# Patient Record
Sex: Male | Born: 1955
Health system: Southern US, Community
[De-identification: ages and names within clinical notes are randomized; demographics above are authoritative.]

## PROBLEM LIST (undated history)

## (undated) DIAGNOSIS — M2060 Acquired deformities of toe(s), unspecified, unspecified foot: Secondary | ICD-10-CM

## (undated) DIAGNOSIS — K5790 Diverticulosis of intestine, part unspecified, without perforation or abscess without bleeding: Secondary | ICD-10-CM

## (undated) DIAGNOSIS — E785 Hyperlipidemia, unspecified: Secondary | ICD-10-CM

## (undated) DIAGNOSIS — N529 Male erectile dysfunction, unspecified: Secondary | ICD-10-CM

## (undated) DIAGNOSIS — K219 Gastro-esophageal reflux disease without esophagitis: Secondary | ICD-10-CM

## (undated) DIAGNOSIS — K921 Melena: Secondary | ICD-10-CM

## (undated) DIAGNOSIS — M21612 Bunion of left foot: Secondary | ICD-10-CM

## (undated) DIAGNOSIS — I480 Paroxysmal atrial fibrillation: Secondary | ICD-10-CM

## (undated) DIAGNOSIS — M21611 Bunion of right foot: Secondary | ICD-10-CM

## (undated) DIAGNOSIS — I358 Other nonrheumatic aortic valve disorders: Secondary | ICD-10-CM

## (undated) HISTORY — DX: Other nonrheumatic aortic valve disorders: I35.8

## (undated) HISTORY — DX: Melena: K92.1

## (undated) HISTORY — PX: ANTERIOR CRUCIATE LIGAMENT REPAIR: SHX115

## (undated) HISTORY — DX: Male erectile dysfunction, unspecified: N52.9

## (undated) HISTORY — PX: VASECTOMY: SHX75

## (undated) HISTORY — DX: Hyperlipidemia, unspecified: E78.5

## (undated) HISTORY — PX: ARTHROSCOPIC REPAIR PCL: SUR81

## (undated) HISTORY — PX: ARTHROSCOPIC REPAIR ACL: SUR80

## (undated) HISTORY — DX: Bunion of left foot: M21.611

## (undated) HISTORY — DX: Gastro-esophageal reflux disease without esophagitis: K21.9

## (undated) HISTORY — PX: CLOSED REDUCTION SHOULDER DISLOCATION: SUR242

## (undated) HISTORY — DX: Paroxysmal atrial fibrillation: I48.0

## (undated) HISTORY — DX: Diverticulosis of intestine, part unspecified, without perforation or abscess without bleeding: K57.90

## (undated) HISTORY — DX: Bunion of right foot: M21.612

## (undated) HISTORY — PX: SHOULDER SURGERY: SHX246

## (undated) HISTORY — PX: OTHER SURGICAL HISTORY: SHX169

## (undated) HISTORY — DX: Acquired deformities of toe(s), unspecified, unspecified foot: M20.60

---

## 2003-12-05 LAB — HM COLONOSCOPY

## 2005-06-05 ENCOUNTER — Encounter: Payer: Self-pay | Admitting: Family Medicine

## 2006-03-08 ENCOUNTER — Encounter: Payer: Self-pay | Admitting: Family Medicine

## 2006-03-09 ENCOUNTER — Encounter: Payer: Self-pay | Admitting: Family Medicine

## 2007-12-05 HISTORY — PX: REFRACTIVE SURGERY: SHX103

## 2008-08-04 ENCOUNTER — Ambulatory Visit: Payer: Self-pay | Admitting: Family Medicine

## 2008-08-04 DIAGNOSIS — Z8719 Personal history of other diseases of the digestive system: Secondary | ICD-10-CM

## 2008-08-06 LAB — CONVERTED CEMR LAB
ALT: 43 units/L (ref 0–53)
AST: 36 units/L (ref 0–37)
Albumin: 4.7 g/dL (ref 3.5–5.2)
BUN: 23 mg/dL (ref 6–23)
Basophils Absolute: 0 10*3/uL (ref 0.0–0.1)
Basophils Relative: 0.2 % (ref 0.0–3.0)
CO2: 28 meq/L (ref 19–32)
Calcium: 9.8 mg/dL (ref 8.4–10.5)
Chloride: 107 meq/L (ref 96–112)
Creatinine, Ser: 0.9 mg/dL (ref 0.4–1.5)
Direct LDL: 119.9 mg/dL
Eosinophils Relative: 0.9 % (ref 0.0–5.0)
Hemoglobin: 15.2 g/dL (ref 13.0–17.0)
Lymphocytes Relative: 20.7 % (ref 12.0–46.0)
MCHC: 34.4 g/dL (ref 30.0–36.0)
MCV: 96 fL (ref 78.0–100.0)
Neutro Abs: 3.6 10*3/uL (ref 1.4–7.7)
Neutrophils Relative %: 70.5 % (ref 43.0–77.0)
RBC: 4.59 M/uL (ref 4.22–5.81)
TSH: 1.16 microintl units/mL (ref 0.35–5.50)
Total Bilirubin: 1.3 mg/dL — ABNORMAL HIGH (ref 0.3–1.2)
Total Protein: 7.8 g/dL (ref 6.0–8.3)
VLDL: 10 mg/dL (ref 0–40)
WBC: 5.1 10*3/uL (ref 4.5–10.5)

## 2008-08-17 ENCOUNTER — Ambulatory Visit: Payer: Self-pay | Admitting: Family Medicine

## 2008-08-17 DIAGNOSIS — E785 Hyperlipidemia, unspecified: Secondary | ICD-10-CM | POA: Insufficient documentation

## 2009-02-15 ENCOUNTER — Ambulatory Visit: Payer: Self-pay | Admitting: Family Medicine

## 2009-02-16 LAB — CONVERTED CEMR LAB
AST: 25 units/L (ref 0–37)
Albumin: 4.9 g/dL (ref 3.5–5.2)
Alkaline Phosphatase: 64 units/L (ref 39–117)
Bilirubin, Direct: 0.1 mg/dL (ref 0.0–0.3)
Indirect Bilirubin: 0.6 mg/dL (ref 0.0–0.9)
LDL Cholesterol: 120 mg/dL — ABNORMAL HIGH (ref 0–99)
Total Bilirubin: 0.7 mg/dL (ref 0.3–1.2)

## 2009-12-04 HISTORY — PX: OTHER SURGICAL HISTORY: SHX169

## 2009-12-04 HISTORY — PX: TENDON REPAIR: SHX5111

## 2009-12-04 HISTORY — PX: WISDOM TOOTH EXTRACTION: SHX21

## 2009-12-04 HISTORY — PX: BUNIONECTOMY: SHX129

## 2010-10-26 ENCOUNTER — Ambulatory Visit: Payer: Self-pay | Admitting: Family Medicine

## 2010-10-30 LAB — CONVERTED CEMR LAB
Albumin: 4.7 g/dL (ref 3.5–5.2)
BUN: 19 mg/dL (ref 6–23)
CO2: 27 meq/L (ref 19–32)
Chloride: 103 meq/L (ref 96–112)
Creatinine, Ser: 0.87 mg/dL (ref 0.40–1.50)
Glucose, Bld: 89 mg/dL (ref 70–99)
Indirect Bilirubin: 0.4 mg/dL (ref 0.0–0.9)
LDL Cholesterol: 133 mg/dL — ABNORMAL HIGH (ref 0–99)
PSA: 0.28 ng/mL (ref <=4.00)
Potassium: 4.5 meq/L (ref 3.5–5.3)
Total Bilirubin: 0.5 mg/dL (ref 0.3–1.2)
Total Protein: 7.2 g/dL (ref 6.0–8.3)
Triglycerides: 67 mg/dL (ref <150)
VLDL: 13 mg/dL (ref 0–40)

## 2010-10-31 LAB — CONVERTED CEMR LAB
Basophils Relative: 0.4 % (ref 0.0–3.0)
Eosinophils Relative: 2.8 % (ref 0.0–5.0)
HCT: 41.3 % (ref 39.0–52.0)
Monocytes Relative: 15.5 % — ABNORMAL HIGH (ref 3.0–12.0)
Neutrophils Relative %: 51.5 % (ref 43.0–77.0)
Platelets: 177 10*3/uL (ref 150.0–400.0)
RBC: 4.34 M/uL (ref 4.22–5.81)
WBC: 2.9 10*3/uL — ABNORMAL LOW (ref 4.5–10.5)

## 2010-11-14 ENCOUNTER — Ambulatory Visit: Payer: Self-pay | Admitting: Family Medicine

## 2010-11-14 DIAGNOSIS — R748 Abnormal levels of other serum enzymes: Secondary | ICD-10-CM | POA: Insufficient documentation

## 2010-11-14 DIAGNOSIS — D72819 Decreased white blood cell count, unspecified: Secondary | ICD-10-CM | POA: Insufficient documentation

## 2010-11-21 LAB — CONVERTED CEMR LAB
ALT: 36 units/L (ref 0–53)
AST: 29 units/L (ref 0–37)
Basophils Relative: 0.2 % (ref 0.0–3.0)
Eosinophils Relative: 2.1 % (ref 0.0–5.0)
HCT: 41.3 % (ref 39.0–52.0)
Hemoglobin: 14.3 g/dL (ref 13.0–17.0)
Lymphocytes Relative: 25.5 % (ref 12.0–46.0)
Monocytes Relative: 6.5 % (ref 3.0–12.0)
Neutro Abs: 3.7 10*3/uL (ref 1.4–7.7)
RBC: 4.31 M/uL (ref 4.22–5.81)

## 2010-12-22 ENCOUNTER — Encounter: Payer: Self-pay | Admitting: Family Medicine

## 2011-01-03 NOTE — Assessment & Plan Note (Signed)
Summary: CPX/CLE   Vital Signs:  Patient profile:   55 year old male Height:      69 inches Weight:      218.75 pounds BMI:     32.42 Temp:     98.5 degrees F oral Pulse rate:   68 / minute Pulse rhythm:   regular BP sitting:   128 / 82  (left arm) Cuff size:   large  Vitals Entered By: Lewanda Rife LPN (October 26, 2010 9:21 AM) CC: CPX   History of Present Illness: here for health mt exam and to review chronic med problems   has been working a lot   had his wisdom teeth pulled 2 days ago  is very relieved about that  has already eaten  a quick recovery is on amox and hydrocodone   had foot surgery with Dr Al Corpus still has some pain- nothing like it was  is able to do more on most days  still needs a fusion   some arthritis in shoulder- dislocated in past -- is working through the pain   wt is up 5 lb will plan to work on that   has been planning some exercise   bp ok at 128/82  lipids last LDL 120 -- in 2009  is time for labs  brother has prostate cancer    colonosc 1/05 with tics only  Td 09 does not want a flu shot   no prostate problems  no change in nocturia  stream is fine     Allergies (verified): No Known Drug Allergies  Past History:  Family History: Last updated: 10/26/2010 Family History Hypertension Family History Lung cancer Family History of Alcoholism/Addiction (other blood relative) Family History Diabetes 1st degree relative (other blood relative)  father -- lung ca, HTN  mother - cancer (? 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 aluminum exposure in a plant (which resolved )   Dr Al Corpus  opthy Dr Mayford Knife   Past Surgical History: (2005) Colonoscopy- tics  (2005) EGD- GERD  ACL sx shoulder sx R fx L wrist  L shoulder dislocation-- reduced under anesthesia  wisdom teeth removed 2011  bunions and hammar toes repaired and tendon repair 2011 lasik surgery 2009  Family History: Family History Hypertension Family History Lung cancer Family History of Alcoholism/Addiction (other blood relative) Family History Diabetes 1st degree relative (other blood relative)  father -- lung ca, HTN  mother - cancer (? primary pancreatic )  sister -- diverticuliits 2 brothers -- diverticulitis  2 brothers alcoholism  aunt DM cousin DM brother with prostate cancer  Review of Systems General:  Denies fatigue, loss of appetite, and malaise. Eyes:  Denies blurring and eye irritation. CV:  Denies chest pain or discomfort, palpitations, and shortness of breath with exertion. Resp:  Denies cough and wheezing. GI:  Denies abdominal pain, change in bowel habits, indigestion,  and nausea. MS:  Denies joint pain, joint redness, and joint swelling. Derm:  Denies itching, lesion(s), poor wound healing, and rash. Neuro:  Denies numbness and tingling. Psych:  Denies anxiety and depression. Endo:  Denies cold intolerance, excessive thirst, excessive urination, and heat intolerance. Heme:  Denies abnormal bruising and bleeding.  Physical Exam  General:  overweight but generally well appearing  Head:  normocephalic, atraumatic, and no abnormalities observed.   Eyes:  vision grossly intact, pupils equal, pupils round, and pupils reactive to light.  no conjunctival  pallor, injection or icterus  Ears:  R ear normal and L ear normal.   Nose:  no nasal discharge.   Mouth:  pharynx pink and moist.   Neck:  supple with full rom and no masses or thyromegally, no JVD or carotid bruit  Chest Wall:  No deformities, masses, tenderness or gynecomastia noted. Lungs:  Normal respiratory effort, chest expands symmetrically. Lungs are clear to auscultation, no crackles or wheezes. Heart:  Normal rate and regular rhythm. S1 and S2 normal without gallop, murmur, click, rub or other extra sounds. Abdomen:  Bowel sounds positive,abdomen soft and non-tender without masses, organomegaly or hernias noted.  no renal bruits  Rectal:  No external abnormalities noted. Normal sphincter tone. No rectal masses or tenderness. Prostate:  Prostate gland firm and smooth, no enlargement, nodularity, tenderness, mass, asymmetry or induration. Msk:  No deformity or scoliosis noted of thoracic or lumbar spine.  no acute joint changes Pulses:  R and L carotid,radial,femoral,dorsalis pedis and posterior tibial pulses are full and equal bilaterally Extremities:  No clubbing, cyanosis, edema, or deformity noted with normal full range of motion of all joints.   Neurologic:  sensation intact to light touch, gait normal, and DTRs symmetrical and normal.   Skin:  flesh colored shiny papule L of nose  erythematous area 1 cm under R eye lentigos on back Cervical Nodes:  No lymphadenopathy noted Inguinal Nodes:  No significant adenopathy Psych:  normal affect, talkative and pleasant    Impression & Recommendations:  Problem # 1:  HEALTH MAINTENANCE EXAM (ICD-V70.0) Assessment Comment Only reviewed health habits including diet, exercise and skin cancer prevention reviewed health maintenance list and family history  wellness labs today  Orders: Venipuncture (60454) TLB-CBC Platelet - w/Differential (85025-CBCD) T- * Misc. Laboratory test [phone])  Problem # 2:  SPECIAL SCREENING MALIGNANT  NEOPLASM OF PROSTATE (ICD-V76.44) Assessment: Comment Only nl dre check psa  family hx - aware  no symptoms - disc what to watch for   Problem # 3:  HYPERLIPIDEMIA (ICD-272.4) Assessment: Unchanged  rev low sat fat diet  lab today Orders: Venipuncture (91478) TLB-CBC Platelet - w/Differential (85025-CBCD) T- * Misc. Laboratory test [phone])  Labs Reviewed: SGOT: 25 (02/15/2009)   SGPT: 40 (02/15/2009)   HDL:58 (02/15/2009), 44.3 (08/04/2008)  LDL:120 (02/15/2009), DEL (08/04/2008)  Chol:193 (02/15/2009), 202 (08/04/2008)  Trig:74 (02/15/2009), 52 (08/04/2008)  Problem # 4:  NEOPLASM OF UNCERTAIN BEHAVIOR OF SKIN (ICD-238.2) Assessment: New area of redness under R eye as well as several flesh colored moles on face ref to derm  Orders: Dermatology Referral (Derma)  Complete Medication List: 1)  Amoxicillin 500 Mg Caps (Amoxicillin) .... One capsule by mouth three times a day until finished 2)  Hydrocodone-acetaminophen 7.5-750 Mg Tabs (Hydrocodone-acetaminophen) .... One tablet by mouth every 4-6 hours as needed for dental pain.  Patient Instructions: 1)  keep working on healthy diet and exercise  2)  wellness labs today including psa   Orders Added:  1)  Venipuncture [16109] 2)  TLB-CBC Platelet - w/Differential [85025-CBCD] 3)  Dermatology Referral [Derma] 4)  T- * Misc. Laboratory test [99999] 5)  Est. Patient 40-64 years [phone]    Current Allergies (reviewed today): No known allergies

## 2011-01-05 NOTE — Assessment & Plan Note (Signed)
Summary: FOLLOW UP LAB PER DR TOWER/RI   Vital Signs:  Patient profile:   55 year old male Height:      69 inches Weight:      222.25 pounds BMI:     32.94 Temp:     98 degrees F oral Pulse rate:   64 / minute Pulse rhythm:   regular BP sitting:   114 / 72  (left arm) Cuff size:   large  Vitals Entered By: Lewanda Rife LPN (November 14, 2010 4:06 PM) CC: 30 min appt for follow up after labs   History of Present Illness: here for f/u of lipids and low wbc as well as inc lfts  wt i sup 3 lb  bp good   ast/alt 51 and 62 which is up  has been on some amoxicillin for wisdom teeth  also codiene with acetaminophen   LDL chol up from 120 to 133  wbc is 2.9  no cold or flu symptoms no fever  feels good overall   his aches and pains are not new   drinks some beer on the weekend - but not lately  not a smoker    no one in family with blood disorder    Allergies (verified): No Known Drug Allergies  Past History:  Past Medical History: Last updated: 10/26/2010 Diverticulitis, hx of Blood in stool, hx of Fungus on R great toe 2nd toe deformed Bunnions bilat feet GERD-- took prilosec for a while  zoster 9/09 mild hyperlipidemia  past hx of inc lfts-- from ? aluminum exposure in a plant (which resolved )   Dr Al Corpus  opthy Dr Mayford Knife   Past Surgical History: Last updated: 10/26/2010 (2005) Colonoscopy- tics  (2005) EGD- GERD  ACL sx shoulder sx R fx L wrist  L shoulder dislocation-- reduced under anesthesia  wisdom teeth removed 2011  bunions and hammar toes repaired and tendon repair 2011 lasik surgery 2009  Family History: Last updated: 10/26/2010 Family History Hypertension Family History Lung cancer Family History of Alcoholism/Addiction (other blood relative) Family History Diabetes 1st degree relative (other blood relative)  father -- lung ca, HTN  mother - cancer (? 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 Eyes:  Denies blurring and eye irritation. CV:  Denies chest pain or discomfort and lightheadness. Resp:  Denies cough, shortness of breath, and wheezing. GI:  Denies abdominal pain, change in bowel habits, indigestion, nausea, and vomiting. GU:  Denies dysuria and urinary frequency. MS:  Denies joint pain, joint redness, and joint swelling. Derm:  Denies itching, lesion(s), poor wound healing, and rash. Neuro:  Denies headaches, numbness, and tingling. Psych:  Denies anxiety and depression. Endo:  Denies cold intolerance and heat intolerance. Heme:  Denies abnormal bruising and bleeding.  Physical Exam  General:  overweight but generally well appearing  Head:  normocephalic, atraumatic, and no abnormalities observed.   Eyes:  vision grossly intact, pupils equal, pupils round, and pupils reactive to light.  no conjunctival pallor, injection or icterus  Mouth:  pharynx pink and  moist.   Neck:  supple with full rom and no masses or thyromegally, no JVD or carotid bruit  Chest Wall:  No deformities, masses, tenderness or gynecomastia noted. Lungs:  Normal respiratory effort, chest expands symmetrically. Lungs are clear to auscultation, no crackles or wheezes. Heart:  Normal rate and regular rhythm. S1 and S2 normal without gallop, murmur, click, rub or other extra sounds. Abdomen:  Bowel sounds positive,abdomen soft and non-tender without masses, organomegaly or hernias noted.  no renal bruits  Msk:  No deformity or  scoliosis noted of thoracic or lumbar spine.  no acute joint changes Pulses:  R and L carotid,radial,femoral,dorsalis pedis and posterior tibial pulses are full and equal bilaterally Extremities:  No clubbing, cyanosis, edema, or deformity noted with normal full range of motion of all joints.   Neurologic:  sensation intact to light touch, gait normal, and DTRs symmetrical and normal.   Skin:  flesh colored shiny papule L of nose  erythematous area 1 cm under R eye lentigos on back Cervical Nodes:  No lymphadenopathy noted Inguinal Nodes:  No significant adenopathy Psych:  normal affect, talkative and pleasant    Impression & Recommendations:  Problem # 1:  LEUKOCYTOPENIA UNSPECIFIED (ICD-288.50) Assessment New re check this  no outside reason for it  asymptomatic  if abn- willref to heme Orders: Venipuncture (16109) TLB-ALT (SGPT) (84460-ALT) TLB-AST (SGOT) (84450-SGOT) TLB-CBC Platelet - w/Differential (85025-CBCD)  Problem # 2:  OTHER NONSPECIFIC ABNORMAL SERUM ENZYME LEVELS (ICD-790.5) Assessment: Deteriorated disc cutting out alcohol  also avoid tylenol and otc meds and herbs  Orders: Venipuncture (60454) TLB-ALT (SGPT) (84460-ALT) TLB-AST (SGOT) (84450-SGOT) TLB-CBC Platelet - w/Differential (85025-CBCD)  Problem # 3:  HYPERLIPIDEMIA (ICD-272.4) Assessment: Deteriorated chol up a bit  disc dec sat fats in diet and inc fiber and exercise  will continue to monitor  rev goals for hDL and LDL  Labs Reviewed: SGOT: 51 (10/26/2010)   SGPT: 62 (10/26/2010)   HDL:65 (10/26/2010), 58 (02/15/2009)  LDL:133 (10/26/2010), 120 (02/15/2009)  Chol:211 (10/26/2010), 193 (02/15/2009)  Trig:67 (10/26/2010), 74 (02/15/2009)  Patient Instructions: 1)  checking labs for white blood cell count and liver today  2)  no tylenol/ acetaminophen  3)  please minimize alcohol as much as possible- it is not good for your liver and has lots of calories  4)  if you have fever or any other  symptoms - let me know  5)  if your white count is not improved -- I will refer you to a hematologist (blood specialist)    Orders Added: 1)  Venipuncture [36415] 2)  TLB-ALT (SGPT) [84460-ALT] 3)  TLB-AST (SGOT) [84450-SGOT] 4)  TLB-CBC Platelet - w/Differential [85025-CBCD] 5)  Est. Patient Level IV [09811]    Prior Medications (reviewed today): None Current Allergies (reviewed today): No known allergies

## 2011-01-19 NOTE — Consult Note (Signed)
Summary: [facility]  [facility]   Imported By: Maryln Gottron 01/06/2011 14:55:24  _____________________________________________________________________  External Attachment:    Type:   Image     Comment:   External Document

## 2011-04-22 ENCOUNTER — Inpatient Hospital Stay (INDEPENDENT_AMBULATORY_CARE_PROVIDER_SITE_OTHER)
Admission: RE | Admit: 2011-04-22 | Discharge: 2011-04-22 | Disposition: A | Discharge status: Home / Self Care | Payer: BC Managed Care – PPO | Source: Ambulatory Visit | Attending: Family Medicine | Admitting: Family Medicine

## 2011-04-22 DIAGNOSIS — H6120 Impacted cerumen, unspecified ear: Secondary | ICD-10-CM

## 2011-04-22 DIAGNOSIS — H60399 Other infective otitis externa, unspecified ear: Secondary | ICD-10-CM

## 2011-05-16 ENCOUNTER — Other Ambulatory Visit: Payer: Self-pay | Admitting: Family Medicine

## 2011-05-16 DIAGNOSIS — D72819 Decreased white blood cell count, unspecified: Secondary | ICD-10-CM

## 2011-05-16 DIAGNOSIS — R748 Abnormal levels of other serum enzymes: Secondary | ICD-10-CM

## 2011-05-16 DIAGNOSIS — E785 Hyperlipidemia, unspecified: Secondary | ICD-10-CM

## 2011-05-24 ENCOUNTER — Other Ambulatory Visit: Payer: Self-pay

## 2011-05-24 ENCOUNTER — Other Ambulatory Visit (INDEPENDENT_AMBULATORY_CARE_PROVIDER_SITE_OTHER): Payer: BC Managed Care – PPO

## 2011-05-24 DIAGNOSIS — D72819 Decreased white blood cell count, unspecified: Secondary | ICD-10-CM

## 2011-05-24 DIAGNOSIS — R748 Abnormal levels of other serum enzymes: Secondary | ICD-10-CM

## 2011-05-24 DIAGNOSIS — E785 Hyperlipidemia, unspecified: Secondary | ICD-10-CM

## 2011-05-25 LAB — CBC WITH DIFFERENTIAL/PLATELET
Basophils Relative: 0.2 % (ref 0.0–3.0)
Eosinophils Relative: 1.1 % (ref 0.0–5.0)
HCT: 42.4 % (ref 39.0–52.0)
Hemoglobin: 14.9 g/dL (ref 13.0–17.0)
Lymphs Abs: 1.4 10*3/uL (ref 0.7–4.0)
MCHC: 35 g/dL (ref 30.0–36.0)
MCV: 96.3 fl (ref 78.0–100.0)
Monocytes Absolute: 0.4 10*3/uL (ref 0.1–1.0)
Neutro Abs: 3.9 10*3/uL (ref 1.4–7.7)
RBC: 4.4 Mil/uL (ref 4.22–5.81)
WBC: 5.8 10*3/uL (ref 4.5–10.5)

## 2011-05-25 LAB — HEPATIC FUNCTION PANEL
Bilirubin, Direct: 0.1 mg/dL (ref 0.0–0.3)
Total Bilirubin: 0.9 mg/dL (ref 0.3–1.2)

## 2011-05-25 LAB — LIPID PANEL
HDL: 61.1 mg/dL (ref >39.00)
Total CHOL/HDL Ratio: 4
Triglycerides: 65 mg/dL (ref 0.0–149.0)

## 2011-05-31 ENCOUNTER — Telehealth: Payer: Self-pay

## 2011-05-31 NOTE — Telephone Encounter (Signed)
Patient notified as i/nstructed by telephone. Fasting Lab appt scheduled as instructed 11/14/11 at 4pm with CPX scheduled 11/21/11 at 2:30pm.

## 2011-05-31 NOTE — Telephone Encounter (Signed)
Message copied by Patience Musca on Wed May 31, 2011 11:12 AM ------      Message from: Roxy Manns A      Created: Sun May 28, 2011 12:47 PM       Chol is up a bit -- for the most part stable      Avoid red meat/ fried foods/ egg yolks/ fatty breakfast meats/ butter, cheese and high fat dairy/ and shellfish       Liver tests normal       Blood count normal - reassuring       F/u dec for PE with labs prior

## 2011-11-13 ENCOUNTER — Telehealth: Payer: Self-pay | Admitting: Internal Medicine

## 2011-11-13 DIAGNOSIS — Z125 Encounter for screening for malignant neoplasm of prostate: Secondary | ICD-10-CM | POA: Insufficient documentation

## 2011-11-13 DIAGNOSIS — E785 Hyperlipidemia, unspecified: Secondary | ICD-10-CM

## 2011-11-13 DIAGNOSIS — Z0000 Encounter for general adult medical examination without abnormal findings: Secondary | ICD-10-CM | POA: Insufficient documentation

## 2011-11-13 NOTE — Telephone Encounter (Signed)
Patient had a lab appointment for tomorrow at 4:00.  Just wanted to know if he has to fast all day because he stated he would be at work

## 2011-11-13 NOTE — Telephone Encounter (Signed)
Message copied by Judy Pimple on Mon Nov 13, 2011  9:07 PM ------      Message from: Alvina Chou      Created: Mon Nov 13, 2011  3:43 PM      Regarding: labs for Tues 12-11       Patient is scheduled for CPX labs, please order future labs, Thanks , Camelia Eng

## 2011-11-14 ENCOUNTER — Other Ambulatory Visit: Payer: BC Managed Care – PPO

## 2011-11-21 ENCOUNTER — Encounter: Payer: Self-pay | Admitting: Family Medicine

## 2011-11-21 ENCOUNTER — Ambulatory Visit (INDEPENDENT_AMBULATORY_CARE_PROVIDER_SITE_OTHER): Payer: BC Managed Care – PPO | Admitting: Family Medicine

## 2011-11-21 VITALS — BP 118/76 | HR 72 | Temp 98.0°F | Ht 69.0 in | Wt 220.8 lb

## 2011-11-21 DIAGNOSIS — E785 Hyperlipidemia, unspecified: Secondary | ICD-10-CM

## 2011-11-21 DIAGNOSIS — Z23 Encounter for immunization: Secondary | ICD-10-CM

## 2011-11-21 DIAGNOSIS — Z0000 Encounter for general adult medical examination without abnormal findings: Secondary | ICD-10-CM

## 2011-11-21 DIAGNOSIS — Z125 Encounter for screening for malignant neoplasm of prostate: Secondary | ICD-10-CM

## 2011-11-21 NOTE — Patient Instructions (Addendum)
Flu shot today Work on healthy diet and exercise for weight loss  Labs today

## 2011-11-21 NOTE — Assessment & Plan Note (Signed)
Reviewed health habits including diet and exercise and skin cancer prevention Also reviewed health mt list, fam hx and immunizations   Wellness labs today Outlined plan of reduced calorie diet and exercise for wt loss

## 2011-11-21 NOTE — Assessment & Plan Note (Signed)
Lipids last time mildly high Disc goals for lipids and reasons to control them Rev labs with pt Rev low sat fat diet in detail Did work on diet transiently  Given handout  Labs today

## 2011-11-21 NOTE — Progress Notes (Signed)
Subjective:    Patient ID: Shane Gonzalez, male    DOB: May 10, 1956, 55 y.o.   MRN: [id number]  HPI Here for health maintenance exam and to review chronic medical problems   Is doing well overall  Getting more aches and pains the older he gets -- feet and knees at times   Had foot surgery a while back Had to also get rid of his orthotics  Works on Research scientist (medical) he has oa knees- shoulder surgery / etc   Does some exercise - limited by rotator cuff problems  Eventually wants to take care of it   Very busy -- daughters getting married   bp good at 118/76 Wt is down 2 lb with bmi of 32  Wants to loose even more  Lost more and gained some back -- candy and treats lately   Going to [place] in feb- looking forward to it    Wants to do his labs today   Hx of mildly high lipids Lab Results  Component Value Date   CHOL 223* 05/24/2011   HDL 61.10 05/24/2011   LDLCALC 133* 10/26/2010   LDLDIRECT 129.9 05/24/2011   TRIG 65.0 05/24/2011   CHOLHDL 4 05/24/2011    Diet - was better for a while and then fell off the wagon  Disc low sat fat at last visit  Is drinking juice - thinks that helps    Prostate screen - no problems at all  Nocturia 1-2 times per night all his life - drinks a lot of green tea  Brother had prostate cancer   Td 09 Flu shot - has not had   Colon screen - colonosc was 1/05 with diverticulosis - 10 year follow up   Patient Active Problem List  Diagnoses  . HYPERLIPIDEMIA  . DIVERTICULITIS, HX OF  . LEUKOCYTOPENIA UNSPECIFIED  . OTHER NONSPECIFIC ABNORMAL SERUM ENZYME LEVELS  . Routine general medical examination at a health care facility  . Prostate cancer screening   Past Medical History  Diagnosis Date  . Diverticulosis     Hx of  . Blood in stool     Hx of  . Toenail fungus     rt big toe  . Bilateral bunions     both feet  . Toe deformity     2nd  . GERD (gastroesophageal reflux disease)   . Hyperlipidemia    Past Surgical History    Procedure Date  . Anterior cruciate ligament repair   . Shoulder surgery     right  . Closed reduction shoulder dislocation     left reduced under anesthesia  . Wisdom tooth extraction 2011  . Bunionectomy 2011  . Hammer toe repair 2011  . Tendon repair 2011  . Refractive surgery 2009   History  Substance Use Topics  . Smoking status: Never Smoker   . Smokeless tobacco: Not on file  . Alcohol Use: Yes     weekends 4 beers   Family History  Problem Relation Age of Onset  . Cancer Mother     ? pancreatic  . Cancer Father     lung ca  . Hypertension Father   . Diverticulitis Sister   . Diverticulitis Brother    No Known Allergies No current outpatient prescriptions on file prior to visit.          Review of Systems Review of Systems  Constitutional: Negative for fever, appetite change, fatigue and unexpected weight change.  Eyes: Negative for pain  and visual disturbance.  Respiratory: Negative for cough and shortness of breath.   Cardiovascular: Negative for cp or palpitations    Gastrointestinal: Negative for nausea, diarrhea and constipation.  Genitourinary: Negative for urgency and frequency.  Skin: Negative for pallor or rash   MSK pos for knee pain and shoulder pain with arthritis, no joint swelling  Neurological: Negative for weakness, light-headedness, numbness and headaches.  Hematological: Negative for adenopathy. Does not bruise/bleed easily.  Psychiatric/Behavioral: Negative for dysphoric mood. The patient is not nervous/anxious.          Objective:   Physical Exam  Constitutional: He appears well-developed and well-nourished. No distress.  HENT:  Head: Normocephalic and atraumatic.  Right Ear: External ear normal.  Left Ear: External ear normal.  Nose: Nose normal.  Mouth/Throat: Oropharynx is clear and moist.       Scant cerumen  Eyes: Conjunctivae and EOM are normal. Pupils are equal, round, and reactive to light. No scleral icterus.   Neck: Normal range of motion. Neck supple. No JVD present. Carotid bruit is not present. No thyromegaly present.  Cardiovascular: Normal rate, regular rhythm, normal heart sounds and intact distal pulses.  Exam reveals no gallop.   Pulmonary/Chest: Effort normal and breath sounds normal. No respiratory distress. He has no wheezes. He exhibits no tenderness.  Abdominal: Soft. Bowel sounds are normal. He exhibits no distension, no abdominal bruit and no mass. There is no tenderness.  Genitourinary: Rectum normal and prostate normal. Guaiac negative stool. Prostate is not enlarged and not tender.  Musculoskeletal: Normal range of motion. He exhibits no edema and no tenderness.  Lymphadenopathy:    He has no cervical adenopathy.  Neurological: He is alert. He has normal reflexes. No cranial nerve deficit. He exhibits normal muscle tone. Coordination normal.  Skin: Skin is warm and dry. No rash noted. No erythema. No pallor.       Solar lentigos diffusely   Psychiatric: He has a normal mood and affect.          Assessment & Plan:

## 2011-11-21 NOTE — Assessment & Plan Note (Signed)
Nl DRE today Brother had prostate cancer psa today and adv No change in nocturia

## 2011-11-22 LAB — CBC WITH DIFFERENTIAL/PLATELET
Basophils Absolute: 0 10*3/uL (ref 0.0–0.1)
Basophils Relative: 0.4 % (ref 0.0–3.0)
HCT: 41.7 % (ref 39.0–52.0)
Hemoglobin: 14.4 g/dL (ref 13.0–17.0)
Lymphs Abs: 1.4 10*3/uL (ref 0.7–4.0)
MCHC: 34.5 g/dL (ref 30.0–36.0)
Monocytes Relative: 7.4 % (ref 3.0–12.0)
Neutro Abs: 3.8 10*3/uL (ref 1.4–7.7)
RBC: 4.33 Mil/uL (ref 4.22–5.81)
RDW: 13 % (ref 11.5–14.6)

## 2011-11-22 LAB — LIPID PANEL
Cholesterol: 213 mg/dL — ABNORMAL HIGH (ref 0–200)
HDL: 65.6 mg/dL (ref >39.00)
Triglycerides: 50 mg/dL (ref 0.0–149.0)

## 2011-11-22 LAB — COMPREHENSIVE METABOLIC PANEL
ALT: 44 U/L (ref 0–53)
BUN: 20 mg/dL (ref 6–23)
CO2: 27 mEq/L (ref 19–32)
Calcium: 9.6 mg/dL (ref 8.4–10.5)
Chloride: 104 mEq/L (ref 96–112)
Creatinine, Ser: 1 mg/dL (ref 0.4–1.5)
GFR: 87.89 mL/min (ref >60.00)
Glucose, Bld: 82 mg/dL (ref 70–99)
Total Bilirubin: 1.3 mg/dL — ABNORMAL HIGH (ref 0.3–1.2)

## 2011-11-22 LAB — TSH: TSH: 2.02 u[IU]/mL (ref 0.35–5.50)

## 2012-11-22 ENCOUNTER — Encounter: Payer: BC Managed Care – PPO | Admitting: Family Medicine

## 2013-03-25 ENCOUNTER — Telehealth: Payer: Self-pay | Admitting: Family Medicine

## 2013-03-25 DIAGNOSIS — E785 Hyperlipidemia, unspecified: Secondary | ICD-10-CM

## 2013-03-25 DIAGNOSIS — Z0000 Encounter for general adult medical examination without abnormal findings: Secondary | ICD-10-CM

## 2013-03-25 DIAGNOSIS — Z125 Encounter for screening for malignant neoplasm of prostate: Secondary | ICD-10-CM

## 2013-03-25 NOTE — Telephone Encounter (Signed)
Message copied by Judy Pimple on Tue Mar 25, 2013  5:54 PM ------      Message from: Alvina Chou      Created: Tue Mar 18, 2013 12:17 PM      Regarding: lab orders for Wednesday, 4.23.14       Patient is scheduled for CPX labs, please order future labs, Thanks , Terri       ------

## 2013-03-26 ENCOUNTER — Other Ambulatory Visit (INDEPENDENT_AMBULATORY_CARE_PROVIDER_SITE_OTHER): Payer: BC Managed Care – PPO

## 2013-03-26 DIAGNOSIS — Z125 Encounter for screening for malignant neoplasm of prostate: Secondary | ICD-10-CM

## 2013-03-26 DIAGNOSIS — E785 Hyperlipidemia, unspecified: Secondary | ICD-10-CM

## 2013-03-26 DIAGNOSIS — Z0000 Encounter for general adult medical examination without abnormal findings: Secondary | ICD-10-CM

## 2013-03-27 LAB — CBC WITH DIFFERENTIAL/PLATELET
Eosinophils Relative: 1.6 % (ref 0.0–5.0)
HCT: 41.3 % (ref 39.0–52.0)
Lymphs Abs: 1.3 10*3/uL (ref 0.7–4.0)
Monocytes Relative: 6.5 % (ref 3.0–12.0)
Platelets: 196 10*3/uL (ref 150.0–400.0)
WBC: 5.4 10*3/uL (ref 4.5–10.5)

## 2013-03-27 LAB — COMPREHENSIVE METABOLIC PANEL
Albumin: 4.7 g/dL (ref 3.5–5.2)
CO2: 30 mEq/L (ref 19–32)
Calcium: 9.5 mg/dL (ref 8.4–10.5)
Chloride: 101 mEq/L (ref 96–112)
GFR: 99.42 mL/min (ref >60.00)
Glucose, Bld: 72 mg/dL (ref 70–99)
Potassium: 4.2 mEq/L (ref 3.5–5.1)
Sodium: 137 mEq/L (ref 135–145)
Total Bilirubin: 0.9 mg/dL (ref 0.3–1.2)
Total Protein: 7.1 g/dL (ref 6.0–8.3)

## 2013-03-27 LAB — LDL CHOLESTEROL, DIRECT: Direct LDL: 172.9 mg/dL

## 2013-03-27 LAB — LIPID PANEL: Cholesterol: 231 mg/dL — ABNORMAL HIGH (ref 0–200)

## 2013-04-04 ENCOUNTER — Encounter: Payer: Self-pay | Admitting: Family Medicine

## 2013-04-04 ENCOUNTER — Ambulatory Visit (INDEPENDENT_AMBULATORY_CARE_PROVIDER_SITE_OTHER): Payer: BC Managed Care – PPO | Admitting: Family Medicine

## 2013-04-04 VITALS — BP 128/78 | HR 76 | Temp 98.7°F | Ht 69.0 in | Wt 222.5 lb

## 2013-04-04 DIAGNOSIS — Z8042 Family history of malignant neoplasm of prostate: Secondary | ICD-10-CM

## 2013-04-04 DIAGNOSIS — E785 Hyperlipidemia, unspecified: Secondary | ICD-10-CM

## 2013-04-04 DIAGNOSIS — Z0000 Encounter for general adult medical examination without abnormal findings: Secondary | ICD-10-CM

## 2013-04-04 DIAGNOSIS — Z23 Encounter for immunization: Secondary | ICD-10-CM

## 2013-04-04 NOTE — Assessment & Plan Note (Signed)
Reviewed health habits including diet and exercise and skin cancer prevention Also reviewed health mt list, fam hx and immunizations  Rev wellness lab in detail  Tdap today (will be around a newborn)

## 2013-04-04 NOTE — Progress Notes (Signed)
Subjective:    Patient ID: Shane Gonzalez, male    DOB: [birth year], 57 y.o.   MRN: [id number]  HPI Here for health maintenance exam and to review chronic medical problems    Has been working a lot and taking care of himself  He recently lose 8 lb - happy about that - plans to keep working on that Is using the total gym   He is addicted to peanut m and m's - knows there is a lot of sugar and fat in them  Expecting new grandchild - excited about that    Wt is up 2 lb with bmi of 32  Flu vaccine-had one in 2013 fall   Td 1/09 - needs a Tdap due to new baby upcoming   Colonoscopy 1/05 with diverticulosis- 10 year recall   Family hx of prostate ca in brother Lab Results  Component Value Date   PSA 0.35 03/26/2013   PSA 0.43 11/21/2011   PSA 0.28 10/26/2010  no prostate problems or difficulty voiding  Nocturia usually 2 times (his whole life)  Drinks a lot of green tea     Hx of hyperlipidemia Lab Results  Component Value Date   CHOL 231* 03/26/2013   CHOL 213* 11/21/2011   CHOL 223* 05/24/2011   Lab Results  Component Value Date   HDL 54.70 03/26/2013   HDL 40.98 11/21/2011   HDL 61.10 05/24/2011   Lab Results  Component Value Date   LDLCALC 133* 10/26/2010   LDLCALC 120* 02/15/2009   Lab Results  Component Value Date   TRIG 66.0 03/26/2013   TRIG 50.0 11/21/2011   TRIG 65.0 05/24/2011   Lab Results  Component Value Date   CHOLHDL 4 03/26/2013   CHOLHDL 3 11/21/2011   CHOLHDL 4 05/24/2011   Lab Results  Component Value Date   LDLDIRECT 172.9 03/26/2013   LDLDIRECT 133.3 11/21/2011   LDLDIRECT 129.9 05/24/2011   his diet was better early this year-now worse- is back to eating red meat and fried food and fattier foods      Chemistry      Component Value Date/Time   NA 137 03/26/2013 1601   K 4.2 03/26/2013 1601   CL 101 03/26/2013 1601   CO2 30 03/26/2013 1601   BUN 21 03/26/2013 1601   CREATININE 0.9 03/26/2013 1601      Component Value Date/Time   CALCIUM  9.5 03/26/2013 1601   ALKPHOS 64 03/26/2013 1601   AST 30 03/26/2013 1601   ALT 47 03/26/2013 1601   BILITOT 0.9 03/26/2013 1601     Lab Results  Component Value Date   TSH 1.95 03/26/2013   Lab Results  Component Value Date   WBC 5.4 03/26/2013   HGB 14.5 03/26/2013   HCT 41.3 03/26/2013   MCV 93.3 03/26/2013   PLT 196.0 03/26/2013      Review of Systems    Review of Systems  Constitutional: Negative for fever, appetite change, fatigue and unexpected weight change.  Eyes: Negative for pain and visual disturbance.  Respiratory: Negative for cough and shortness of breath.   Cardiovascular: Negative for cp or palpitations    Gastrointestinal: Negative for nausea, diarrhea and constipation.  Genitourinary: Negative for urgency and frequency.  Skin: Negative for pallor or rash   Neurological: Negative for weakness, light-headedness, numbness and headaches.  Hematological: Negative for adenopathy. Does not bruise/bleed easily.  Psychiatric/Behavioral: Negative for dysphoric mood. The patient is not nervous/anxious.      Objective:  Physical Exam  Constitutional: He appears well-developed and well-nourished.  obese and well appearing   HENT:  Head: Normocephalic and atraumatic.  Right Ear: External ear normal.  Left Ear: External ear normal.  Nose: Nose normal.  Eyes: Conjunctivae and EOM are normal. Pupils are equal, round, and reactive to light. Right eye exhibits no discharge. Left eye exhibits no discharge. No scleral icterus.  Neck: Normal range of motion. Neck supple. No JVD present. Carotid bruit is not present. No thyromegaly present.  Cardiovascular: Normal rate, regular rhythm, normal heart sounds and intact distal pulses.  Exam reveals no gallop.   Pulmonary/Chest: Effort normal and breath sounds normal. No respiratory distress. He has no wheezes. He has no rales.  No crackles  Abdominal: Soft. Bowel sounds are normal. He exhibits no distension, no abdominal bruit and no  mass. There is no tenderness.  Genitourinary: Rectum normal and prostate normal. Prostate is not enlarged and not tender.  Musculoskeletal: He exhibits no edema and no tenderness.  Lymphadenopathy:    He has no cervical adenopathy.  Neurological: He is alert. He has normal reflexes. No cranial nerve deficit. He exhibits normal muscle tone. Coordination normal.  Skin: Skin is warm and dry. No rash noted. No erythema. No pallor.  Solar lentigos diffusely   Psychiatric: He has a normal mood and affect.          Assessment & Plan:

## 2013-04-04 NOTE — Assessment & Plan Note (Signed)
Cholesterol is up  Disc goals for lipids and reasons to control them Rev labs with pt Rev low sat fat diet in detail  If no imp will need statin Re check 3 mo

## 2013-04-04 NOTE — Patient Instructions (Addendum)
Tdap vaccine today- that is important since you will be around a newborn  For cholesterol --Avoid red meat/ fried foods/ egg yolks/ fatty breakfast meats/ butter, cheese and high fat dairy/ and shellfish   Schedule fasting labs in 3 months for cholesterol profile Keep exercising

## 2013-04-04 NOTE — Assessment & Plan Note (Signed)
Stable/low psa , no symptoms and nl DRE today Will continue to follow

## 2013-07-07 ENCOUNTER — Telehealth: Payer: Self-pay | Admitting: Family Medicine

## 2013-07-07 DIAGNOSIS — E785 Hyperlipidemia, unspecified: Secondary | ICD-10-CM

## 2013-07-07 NOTE — Telephone Encounter (Signed)
Future labs 

## 2013-07-08 ENCOUNTER — Other Ambulatory Visit: Payer: BC Managed Care – PPO

## 2013-07-09 ENCOUNTER — Other Ambulatory Visit (INDEPENDENT_AMBULATORY_CARE_PROVIDER_SITE_OTHER): Payer: BC Managed Care – PPO

## 2013-07-09 DIAGNOSIS — E785 Hyperlipidemia, unspecified: Secondary | ICD-10-CM

## 2013-07-10 LAB — LDL CHOLESTEROL, DIRECT: Direct LDL: 143 mg/dL

## 2013-07-10 LAB — LIPID PANEL
Cholesterol: 214 mg/dL — ABNORMAL HIGH (ref 0–200)
HDL: 65.6 mg/dL (ref >39.00)
VLDL: 7.8 mg/dL (ref 0.0–40.0)

## 2013-07-11 ENCOUNTER — Encounter: Payer: Self-pay | Admitting: *Deleted

## 2015-04-13 ENCOUNTER — Encounter: Payer: Self-pay | Admitting: *Deleted

## 2015-04-20 ENCOUNTER — Ambulatory Visit (INDEPENDENT_AMBULATORY_CARE_PROVIDER_SITE_OTHER): Payer: BLUE CROSS/BLUE SHIELD | Admitting: Family Medicine

## 2015-04-20 ENCOUNTER — Encounter: Payer: Self-pay | Admitting: Family Medicine

## 2015-04-20 VITALS — BP 120/74 | HR 64 | Temp 98.3°F | Resp 14 | Ht 69.0 in | Wt 212.0 lb

## 2015-04-20 DIAGNOSIS — Z801 Family history of malignant neoplasm of trachea, bronchus and lung: Secondary | ICD-10-CM

## 2015-04-20 DIAGNOSIS — Z125 Encounter for screening for malignant neoplasm of prostate: Secondary | ICD-10-CM | POA: Diagnosis not present

## 2015-04-20 DIAGNOSIS — Z1211 Encounter for screening for malignant neoplasm of colon: Secondary | ICD-10-CM | POA: Diagnosis not present

## 2015-04-20 DIAGNOSIS — Z0000 Encounter for general adult medical examination without abnormal findings: Secondary | ICD-10-CM | POA: Diagnosis not present

## 2015-04-20 DIAGNOSIS — E785 Hyperlipidemia, unspecified: Secondary | ICD-10-CM | POA: Diagnosis not present

## 2015-04-20 NOTE — Assessment & Plan Note (Signed)
CPE done, fasting labs, PSA screening 1st degree relative Colon cancer screening- referral to GI With inhalents and exposures with job, family history of lung cancer, CXR to be done Immunizations UTD

## 2015-04-20 NOTE — Progress Notes (Signed)
Patient ID: Shane Gonzalez, male   DOB: [birth year], 59 y.o.   MRN: [id number]   Subjective:    Patient ID: Shane Gonzalez, male    DOB: 07-14-1956, 59 y.o.   MRN: [id number]  Patient presents for CPE  is here to establish care for physical exam. His previous PCP is Dr. Alba Cory. No specific concerns today. His history was reviewed. He does not take any medications. He does have history of hyperlipidemia he's lost 30 pounds intentionally trying to lose weight and get in shape with his wife. He also has family history of prostate cancer and his father he has a strong history of cancer on the paternal side including lung cancer in a few people including his father. 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 He denies any significant cough or congestion. He has history of bilateral foot surgery secondary to bunions and hammertoes. He is history of diverticulosis he is too for repeat colonoscopy states that he has had about 3 colonoscopies in the past due to rectal bleeding. Immunizations are up-to-date  He currently works for [employer] but this plan will be closing down  He is currently being followed by [facility] ophthalmology because of a chronic hordelum of right upper eyelid  He has been seen by dermatology in the past for cryotherapy for actinic keratoses on his right cheek.  These had multiple orthopedic injuries including tear of his anterior cruciate ligament and PCL resource and knee surgery he's had right shoulder surgery as well  Due for fasting labs  Review Of Systems:  GEN- denies fatigue, fever, weight loss,weakness, recent illness HEENT- denies eye drainage, change in vision, nasal discharge, CVS- denies chest pain, palpitations RESP- denies SOB, cough, wheeze ABD- denies N/V, change in stools, abd pain GU- denies dysuria, hematuria, dribbling, incontinence MSK- denies joint pain, muscle aches,  injury Neuro- denies headache, dizziness, syncope, seizure activity       Objective:    BP 120/74 mmHg  Pulse 64  Temp(Src) 98.3 F (36.8 C) (Oral)  Resp 14  Ht 5\' 9"  (1.753 m)  Wt 212 lb (96.163 kg)  BMI 31.29 kg/m2 GEN- NAD, alert and oriented x3 HEENT- PERRL, EOMI, non injected sclera, pink conjunctiva, MMM, oropharynx clear Neck- Supple, no thyromegaly CVS- RRR, no murmur RESP-CTAB ABD-NABS,soft,NT,ND Rectum- Normal tone, FOBT neg, prostate smooth, no nodules Skin- well healed surgical scars on bilat feet, right knee, right shoulder EXT- No edema Pulses- Radial, DP- 2+        Assessment & Plan:      Problem List Items Addressed This Visit    Routine general medical examination at a health care facility - Primary    CPE done, fasting labs, PSA screening 1st degree relative Colon cancer screening- referral to GI With inhalents and exposures with job, family history of lung cancer, CXR to be done Immunizations UTD      Relevant Orders   CBC with Differential/Platelet   Comprehensive metabolic panel   TSH   Prostate cancer screening   Relevant Orders   PSA   Hyperlipidemia   Relevant Orders   Lipid panel    Other Visit Diagnoses    Family history of lung cancer        Relevant Orders    DG Chest 2 View       Note: This dictation was prepared with Dragon dictation along with smaller  Company secretary. Any transcriptional errors that result from this process are unintentional.

## 2015-04-20 NOTE — Patient Instructions (Signed)
Get the chest xray  Done between 8-5pm, [place], [address] We will call with lab results Colonoscopy to be set up  F/U 1 year or as needed

## 2015-04-21 LAB — CBC WITH DIFFERENTIAL/PLATELET
BASOS PCT: 0 % (ref 0–1)
Basophils Absolute: 0 10*3/uL (ref 0.0–0.1)
Eosinophils Absolute: 0.1 10*3/uL (ref 0.0–0.7)
Eosinophils Relative: 3 % (ref 0–5)
HEMATOCRIT: 42.1 % (ref 39.0–52.0)
HEMOGLOBIN: 14.9 g/dL (ref 13.0–17.0)
LYMPHS ABS: 0.9 10*3/uL (ref 0.7–4.0)
LYMPHS PCT: 23 % (ref 12–46)
MCH: 32.5 pg (ref 26.0–34.0)
MCHC: 35.4 g/dL (ref 30.0–36.0)
MCV: 91.9 fL (ref 78.0–100.0)
MONO ABS: 0.3 10*3/uL (ref 0.1–1.0)
MONOS PCT: 8 % (ref 3–12)
MPV: 10.4 fL (ref 8.6–12.4)
NEUTROS ABS: 2.6 10*3/uL (ref 1.7–7.7)
NEUTROS PCT: 66 % (ref 43–77)
Platelets: 183 10*3/uL (ref 150–400)
RBC: 4.58 MIL/uL (ref 4.22–5.81)
RDW: 13.7 % (ref 11.5–15.5)
WBC: 4 10*3/uL (ref 4.0–10.5)

## 2015-04-21 LAB — LIPID PANEL
Cholesterol: 166 mg/dL (ref 0–200)
HDL: 59 mg/dL (ref >=40)
LDL CALC: 95 mg/dL (ref 0–99)
Total CHOL/HDL Ratio: 2.8 Ratio
Triglycerides: 61 mg/dL (ref <150)
VLDL: 12 mg/dL (ref 0–40)

## 2015-04-21 LAB — COMPREHENSIVE METABOLIC PANEL
ALBUMIN: 4.5 g/dL (ref 3.5–5.2)
ALT: 38 U/L (ref 0–53)
AST: 29 U/L (ref 0–37)
Alkaline Phosphatase: 62 U/L (ref 39–117)
BUN: 17 mg/dL (ref 6–23)
CALCIUM: 9.7 mg/dL (ref 8.4–10.5)
CHLORIDE: 105 meq/L (ref 96–112)
CO2: 23 meq/L (ref 19–32)
Creat: 0.79 mg/dL (ref 0.50–1.35)
GLUCOSE: 81 mg/dL (ref 70–99)
POTASSIUM: 4.3 meq/L (ref 3.5–5.3)
SODIUM: 140 meq/L (ref 135–145)
TOTAL PROTEIN: 7.3 g/dL (ref 6.0–8.3)
Total Bilirubin: 0.9 mg/dL (ref 0.2–1.2)

## 2015-04-21 LAB — TSH: TSH: 1.653 u[IU]/mL (ref 0.350–4.500)

## 2015-04-21 LAB — PSA: PSA: 0.34 ng/mL (ref <=4.00)

## 2015-04-26 ENCOUNTER — Ambulatory Visit
Admission: RE | Admit: 2015-04-26 | Discharge: 2015-04-26 | Disposition: A | Discharge status: Home / Self Care | Payer: BLUE CROSS/BLUE SHIELD | Source: Ambulatory Visit | Attending: Family Medicine | Admitting: Family Medicine

## 2015-04-26 DIAGNOSIS — Z801 Family history of malignant neoplasm of trachea, bronchus and lung: Secondary | ICD-10-CM

## 2015-06-11 ENCOUNTER — Encounter: Payer: Self-pay | Admitting: Internal Medicine

## 2015-06-21 ENCOUNTER — Encounter: Payer: Self-pay | Admitting: Gastroenterology

## 2015-07-06 ENCOUNTER — Ambulatory Visit (AMBULATORY_SURGERY_CENTER): Payer: Self-pay

## 2015-07-06 VITALS — Ht 70.0 in | Wt 212.6 lb

## 2015-07-06 DIAGNOSIS — Z1211 Encounter for screening for malignant neoplasm of colon: Secondary | ICD-10-CM

## 2015-07-06 MED ORDER — SUPREP BOWEL PREP KIT 17.5-3.13-1.6 GM/177ML PO SOLN: 1.0000 | Freq: Once | ORAL | Status: DC

## 2015-07-06 NOTE — Progress Notes (Signed)
No allergies to eggs or soy No diet/weight loss meds No past problems with anesthesia No home oxygen  Refused Emmi instructions

## 2015-07-20 ENCOUNTER — Ambulatory Visit (AMBULATORY_SURGERY_CENTER): Payer: BLUE CROSS/BLUE SHIELD | Admitting: Gastroenterology

## 2015-07-20 ENCOUNTER — Encounter: Payer: Self-pay | Admitting: Gastroenterology

## 2015-07-20 VITALS — BP 119/62 | HR 49 | Temp 97.2°F | Resp 54 | Ht 70.0 in | Wt 212.0 lb

## 2015-07-20 DIAGNOSIS — Z1211 Encounter for screening for malignant neoplasm of colon: Secondary | ICD-10-CM | POA: Diagnosis present

## 2015-07-20 LAB — HM COLONOSCOPY

## 2015-07-20 MED ORDER — SODIUM CHLORIDE 0.9 % IV SOLN: 500.0000 mL | INTRAVENOUS | Status: DC

## 2015-07-20 NOTE — Progress Notes (Signed)
Report to PACU, RN, vss, BBS= Clear.  

## 2015-07-20 NOTE — Op Note (Signed)
[facility]  [address] [place], [id number]   COLONOSCOPY PROCEDURE REPORT  PATIENT: Shane Gonzalez, Shane Gonzalez  MR#: [id number] BIRTHDATE: 1956-05-12 , 68  yrs. old GENDER: male ENDOSCOPIST: Ladene Artist, MD, [facility] REFERRED KG:MWNUUVO Greenwood, M.D. PROCEDURE DATE:  07/20/2015 PROCEDURE:   Colonoscopy, screening First Screening Colonoscopy - Avg.  risk and is 50 yrs.  old or older Yes.  Prior Negative Screening - Now for repeat screening. N/A  History of Adenoma - Now for follow-up colonoscopy & has been > or = to 3 yrs.  N/A  Polyps removed today? No Recommend repeat exam, <10 yrs? No ASA CLASS:   Class II INDICATIONS:Screening for colonic neoplasia and Colorectal Neoplasm Risk Assessment for this procedure is average risk. MEDICATIONS: Monitored anesthesia care and Propofol 200 mg IV DESCRIPTION OF PROCEDURE:   After the risks benefits and alternatives of the procedure were thoroughly explained, informed consent was obtained.  The digital rectal exam revealed no abnormalities of the rectum.   The LB [id number]  endoscope was introduced through the anus and advanced to the cecum, which was identified by both the appendix and ileocecal valve. No adverse events experienced.   The quality of the prep was excellent. (Suprep was used)  The instrument was then slowly withdrawn as the colon was fully examined. Estimated blood loss is zero unless otherwise noted in this procedure report.    COLON FINDINGS: There was moderate diverticulosis noted in the sigmoid colon with associated muscular hypertrophy.   The examination was otherwise normal.  Retroflexed views revealed no abnormalities. The time to cecum = 0.9 Withdrawal time = 8.3   The scope was withdrawn and the procedure completed. COMPLICATIONS: There were no immediate complications.  ENDOSCOPIC IMPRESSION: 1.   Moderate diverticulosis in the sigmoid colon 2.   The examination was otherwise  normal  RECOMMENDATIONS: 1.  High fiber diet with liberal fluid intake. 2.  Continue current colorectal screening recommendations for "routine risk" patients with a repeat colonoscopy in 10 years.  eSigned:  Ladene Artist, MD, [facility] 07/20/2015 11:20 AM

## 2015-07-20 NOTE — Patient Instructions (Signed)
YOU HAD AN ENDOSCOPIC PROCEDURE TODAY AT [facility]:   Refer to the procedure report that was given to you for any specific questions about what was found during the examination.  If the procedure report does not answer your questions, please call your gastroenterologist to clarify.  If you requested that your care partner not be given the details of your procedure findings, then the procedure report has been included in a sealed envelope for you to review at your convenience later.  YOU SHOULD EXPECT: Some feelings of bloating in the abdomen. Passage of more gas than usual.  Walking can help get rid of the air that was put into your GI tract during the procedure and reduce the bloating. If you had a lower endoscopy (such as a colonoscopy or flexible sigmoidoscopy) you may notice spotting of blood in your stool or on the toilet paper. If you underwent a bowel prep for your procedure, you may not have a normal bowel movement for a few days.  Please Note:  You might notice some irritation and congestion in your nose or some drainage.  This is from the oxygen used during your procedure.  There is no need for concern and it should clear up in a day or so.  SYMPTOMS TO REPORT IMMEDIATELY:   Following lower endoscopy (colonoscopy or flexible sigmoidoscopy):  Excessive amounts of blood in the stool  Significant tenderness or worsening of abdominal pains  Swelling of the abdomen that is new, acute  Fever of 100F or higher   For urgent or emergent issues, a gastroenterologist can be reached at any hour by calling [phone].   DIET: Your first meal following the procedure should be a small meal and then it is ok to progress to your normal diet. Heavy or fried foods are harder to digest and may make you feel nauseous or bloated.  Likewise, meals heavy in dairy and vegetables can increase bloating.  Drink plenty of fluids but you should avoid alcoholic beverages for 24  hours.  ACTIVITY:  You should plan to take it easy for the rest of today and you should NOT DRIVE or use heavy machinery until tomorrow (because of the sedation medicines used during the test).    FOLLOW UP: Our staff will call the number listed on your records the next business day following your procedure to check on you and address any questions or concerns that you may have regarding the information given to you following your procedure. If we do not reach you, we will leave a message.  However, if you are feeling well and you are not experiencing any problems, there is no need to return our call.  We will assume that you have returned to your regular daily activities without incident.  If any biopsies were taken you will be contacted by phone or by letter within the next 1-3 weeks.  Please call us at [phone] if you have not heard about the biopsies in 3 weeks.    SIGNATURES/CONFIDENTIALITY: You and/or your care partner have signed paperwork which will be entered into your electronic medical record.  These signatures attest to the fact that that the information above on your After Visit Summary has been reviewed and is understood.  Full responsibility of the confidentiality of this discharge information lies with you and/or your care -partner.  Diverticulosis handout given High fiber diet with liberal intake Continue regular colorectal screening

## 2015-07-21 ENCOUNTER — Telehealth: Payer: Self-pay

## 2015-07-21 NOTE — Telephone Encounter (Signed)
  Follow up Call-  Call back number 07/20/2015  Post procedure Call Back phone  # [phone]  Permission to leave phone message Yes     Patient questions:  Do you have a fever, pain , or abdominal swelling? No. Pain Score  0 *  Have you tolerated food without any problems? Yes.    Have you been able to return to your normal activities? Yes.    Do you have any questions about your discharge instructions: Diet   No. Medications  No. Follow up visit  No.  Do you have questions or concerns about your Care? No.  Actions: * If pain score is 4 or above: No action needed, pain <4.

## 2015-09-21 ENCOUNTER — Other Ambulatory Visit: Payer: Self-pay

## 2015-09-28 ENCOUNTER — Encounter: Payer: Self-pay | Admitting: Family Medicine

## 2016-03-09 IMAGING — CR DG CHEST 2V
2 series · 2 of 2 positions shown · non-contrast
Comparison: None.

CLINICAL DATA: Family history of lung cancer, occupational exposure
to unknown substance

EXAM:
CHEST  2 VIEW

[view not recorded (1 of 2)]
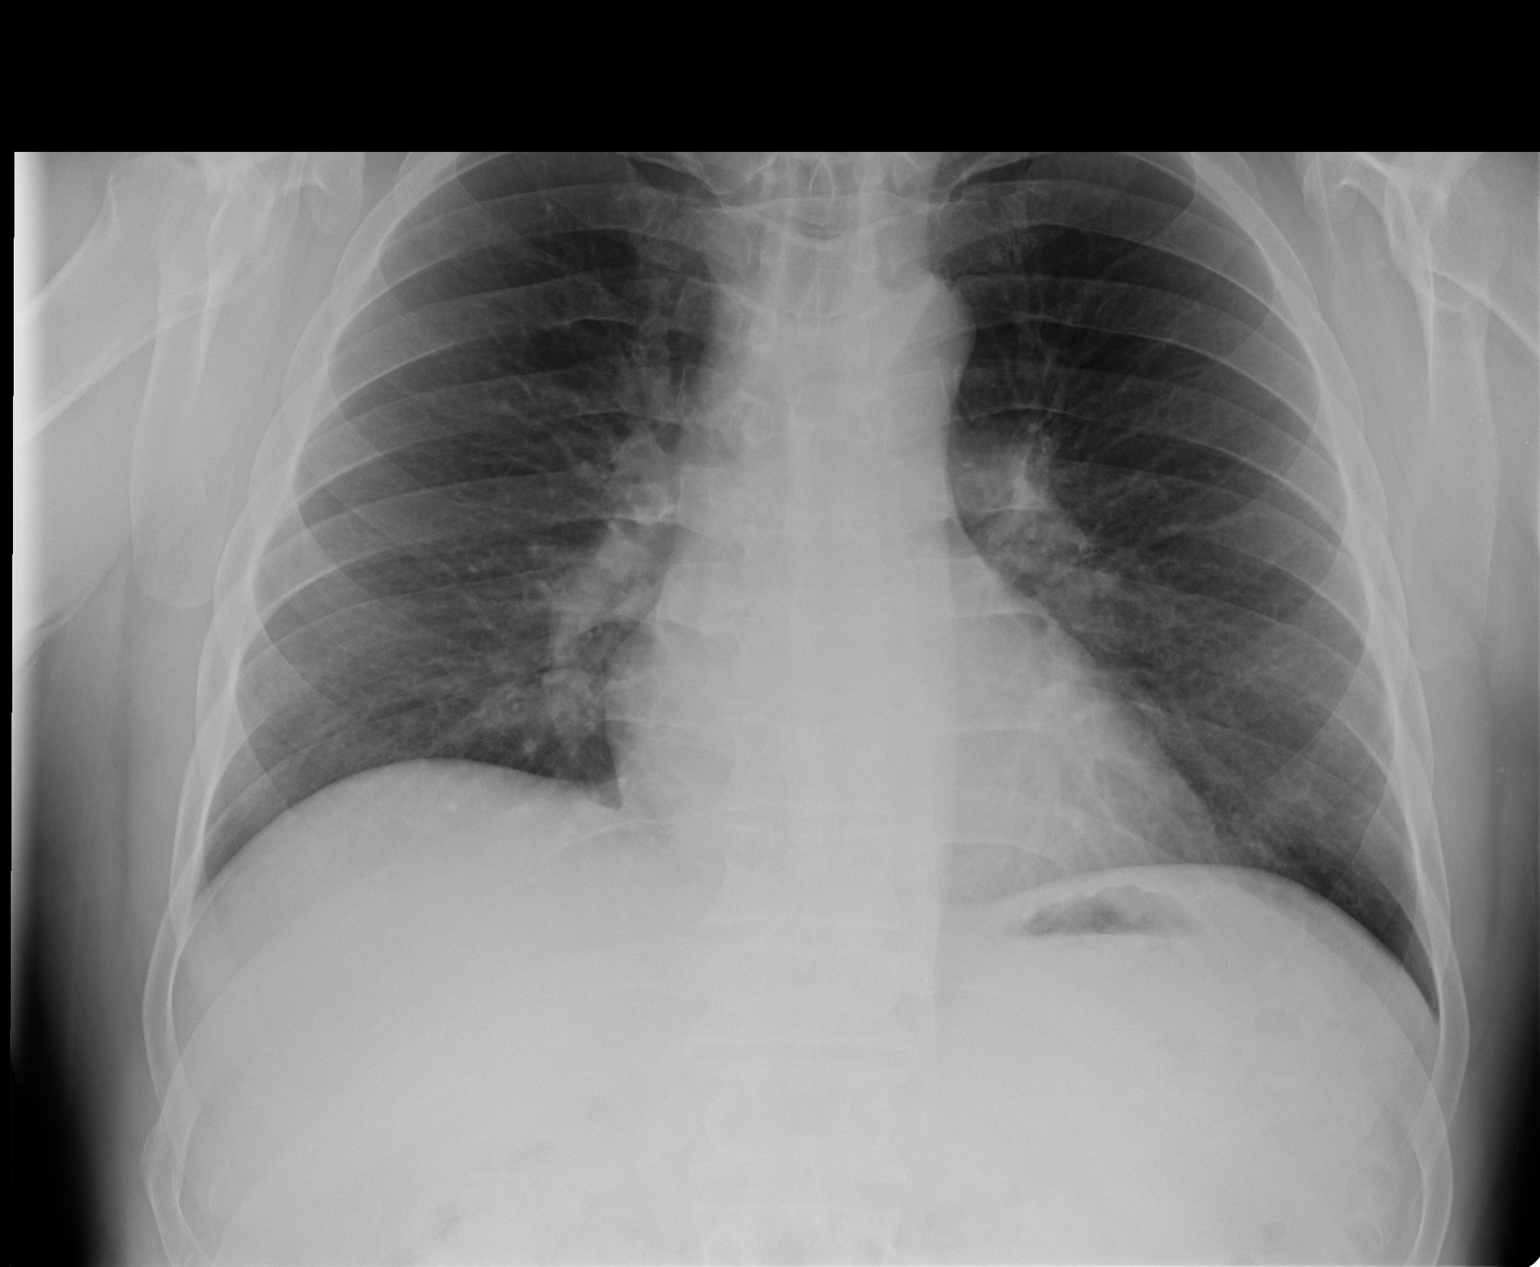

[view not recorded (2 of 2)]
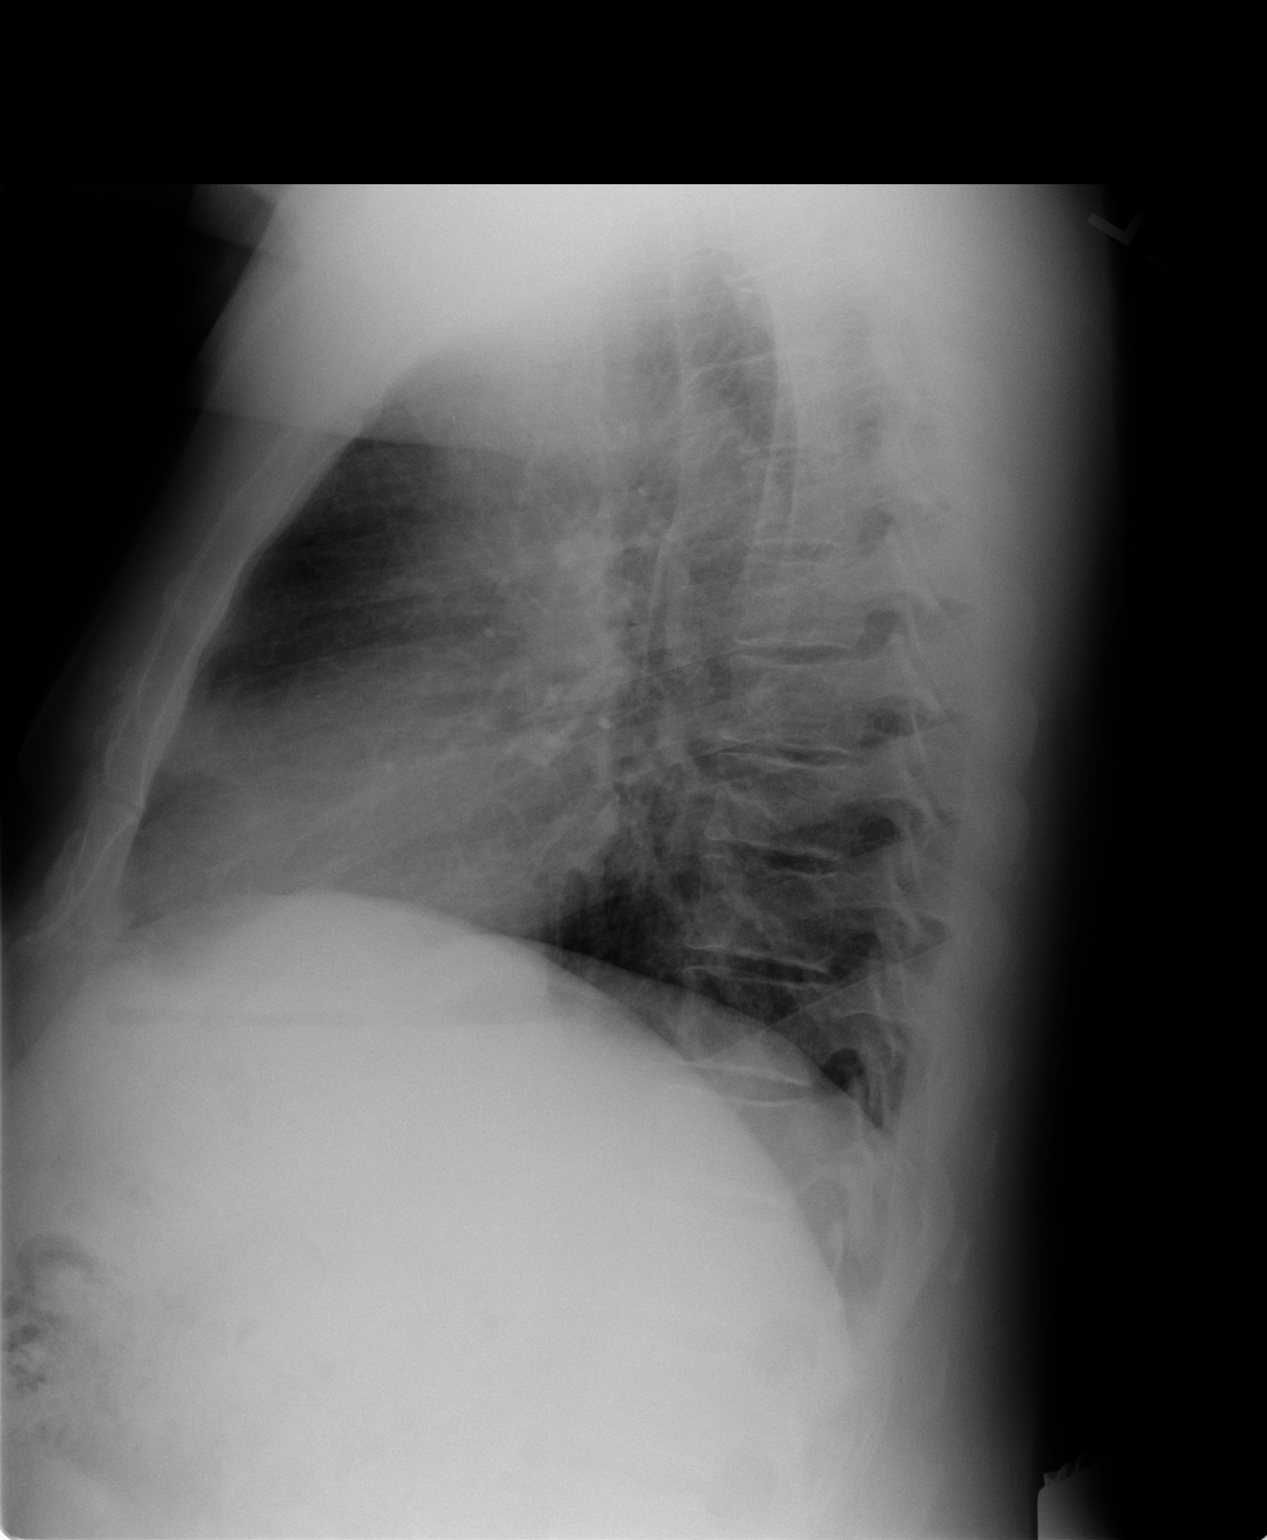

[2 of 2 positions shown; findings below may reference images not displayed]

FINDINGS: The heart size and mediastinal contours are within normal limits.
Both lungs are clear. The visualized skeletal structures are
unremarkable.
IMPRESSION: No active cardiopulmonary disease.

## 2016-04-19 ENCOUNTER — Ambulatory Visit (INDEPENDENT_AMBULATORY_CARE_PROVIDER_SITE_OTHER): Payer: BLUE CROSS/BLUE SHIELD | Admitting: Family Medicine

## 2016-04-19 DIAGNOSIS — Z8042 Family history of malignant neoplasm of prostate: Secondary | ICD-10-CM

## 2016-04-19 DIAGNOSIS — Z0000 Encounter for general adult medical examination without abnormal findings: Secondary | ICD-10-CM

## 2016-04-19 DIAGNOSIS — Z125 Encounter for screening for malignant neoplasm of prostate: Secondary | ICD-10-CM

## 2016-04-19 DIAGNOSIS — E785 Hyperlipidemia, unspecified: Secondary | ICD-10-CM

## 2016-04-19 NOTE — Progress Notes (Signed)
Patient ID: Shane Gonzalez, male   DOB: 19-Feb-1956, 60 y.o.   MRN: [id number]    Subjective:    Patient ID: Shane Gonzalez, male    DOB: [birth year], 60 y.o.   MRN: [id number]  Patient presents for No chief complaint on file.    Colonoscopy UTD TDAP UD   Review Of Systems:  GEN- denies fatigue, fever, weight loss,weakness, recent illness HEENT- denies eye drainage, change in vision, nasal discharge, CVS- denies chest pain, palpitations RESP- denies SOB, cough, wheeze ABD- denies N/V, change in stools, abd pain GU- denies dysuria, hematuria, dribbling, incontinence MSK- denies joint pain, muscle aches, injury Neuro- denies headache, dizziness, syncope, seizure activity       Objective:    There were no vitals taken for this visit. GEN- NAD, alert and oriented x3 HEENT- PERRL, EOMI, non injected sclera, pink conjunctiva, MMM, oropharynx clear Neck- Supple, no thyromegaly CVS- RRR, no murmur RESP-CTAB ABD-NABS,soft,NT,ND EXT- No edema Pulses- Radial, DP- 2+        Assessment & Plan:      Problem List Items Addressed This Visit    None      Note: This dictation was prepared with Dragon dictation along with smaller phrase technology. Any transcriptional errors that result from this process are unintentional.

## 2016-04-26 ENCOUNTER — Encounter: Payer: Self-pay | Admitting: Family Medicine

## 2016-04-26 ENCOUNTER — Ambulatory Visit (INDEPENDENT_AMBULATORY_CARE_PROVIDER_SITE_OTHER): Payer: BLUE CROSS/BLUE SHIELD | Admitting: Family Medicine

## 2016-04-26 VITALS — BP 132/64 | HR 70 | Temp 98.7°F | Resp 14 | Ht 70.0 in | Wt 218.0 lb

## 2016-04-26 DIAGNOSIS — B356 Tinea cruris: Secondary | ICD-10-CM | POA: Diagnosis not present

## 2016-04-26 DIAGNOSIS — N509 Disorder of male genital organs, unspecified: Secondary | ICD-10-CM

## 2016-04-26 DIAGNOSIS — N5089 Other specified disorders of the male genital organs: Secondary | ICD-10-CM

## 2016-04-26 MED ORDER — CLOTRIMAZOLE-BETAMETHASONE 1-0.05 % EX CREA: 1.0000 "application " | TOPICAL_CREAM | Freq: Two times a day (BID) | CUTANEOUS | Status: DC

## 2016-04-26 NOTE — Progress Notes (Signed)
Patient ID: Shane Gonzalez, male   DOB: [birth year], 60 y.o.   MRN: [id number]    Subjective:    Patient ID: Shane Gonzalez, male    DOB: 10-09-1956, 60 y.o.   MRN: [id number]  Patient presents for R Testicle Lump Patient with right testicular mass that he is felt over the past couple weeks. 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 He has no difficulty urinating or difficulty with his bowels no blood in the urine. He does have some discomfort at times with sitting but he also gets irritation and redness in the groin region from sweating on his job.  He does have family history of prostate cancer in his brother  Review Of Systems:  GEN- denies fatigue, fever, weight loss,weakness, recent illness HEENT- denies eye drainage, change in vision, nasal discharge, CVS- denies chest pain, palpitations RESP- denies SOB, cough, wheeze ABD- denies N/V, change in stools, abd pain GU- denies dysuria, hematuria, dribbling, incontinence MSK- denies joint pain, muscle aches, injury Neuro- denies headache, dizziness, syncope, seizure activity       Objective:    BP 132/64 mmHg  Pulse 70  Temp(Src) 98.7 F (37.1 C) (Oral)  Resp 14  Ht 5\' 10"  (1.778 m)  Wt 218 lb (98.884 kg)  BMI 31.28 kg/m2 GEN- NAD, alert and oriented x3 CVS- RRR, no murmur RESP-CTAB ABD-NABS,soft,NT,ND GU- Right testicle small mass palpated on end of tesicle, mild TTP, no lesions on scrotum, no appreciable inguinal hernia, left testicle normal size  Skin- plaque like erythema in inguinal crease right side and upper mons pubis, +moisture, mild maceration of skin  EXT- No edema Pulses- Radial 2+        Assessment & Plan:      Problem List Items Addressed This Visit    None    Visit Diagnoses    Testicular mass    -  Primary    Ultrasound to be done, r/o mass, vs hydrocole or cyst/nodule in vascular structures    Tinea cruris        Lotrisone,  gold bonds powder    Relevant Medications    clotrimazole-betamethasone (LOTRISONE) cream       Note: This dictation was prepared with Dragon dictation along with smaller phrase technology. Any transcriptional errors that result from this process are unintentional.

## 2016-04-26 NOTE — Patient Instructions (Addendum)
Ultrasound tomorrow  lotrisone cream for rash in groin F/U pending results

## 2016-04-27 ENCOUNTER — Ambulatory Visit
Admission: RE | Admit: 2016-04-27 | Discharge: 2016-04-27 | Disposition: A | Discharge status: Home / Self Care | Payer: BLUE CROSS/BLUE SHIELD | Source: Ambulatory Visit | Attending: Family Medicine | Admitting: Family Medicine

## 2016-04-27 DIAGNOSIS — N5089 Other specified disorders of the male genital organs: Secondary | ICD-10-CM

## 2018-01-22 ENCOUNTER — Ambulatory Visit (INDEPENDENT_AMBULATORY_CARE_PROVIDER_SITE_OTHER): Payer: BLUE CROSS/BLUE SHIELD | Admitting: Family Medicine

## 2018-01-22 ENCOUNTER — Encounter: Payer: Self-pay | Admitting: Family Medicine

## 2018-01-22 ENCOUNTER — Other Ambulatory Visit: Payer: Self-pay

## 2018-01-22 VITALS — BP 122/80 | HR 79 | Temp 98.1°F | Resp 14 | Ht 69.25 in | Wt 245.6 lb

## 2018-01-22 DIAGNOSIS — B351 Tinea unguium: Secondary | ICD-10-CM

## 2018-01-22 DIAGNOSIS — L719 Rosacea, unspecified: Secondary | ICD-10-CM

## 2018-01-22 MED ORDER — CICLOPIROX 8 % EX SOLN: Freq: Every day | CUTANEOUS | 2 refills | Status: DC

## 2018-01-22 MED ORDER — METRONIDAZOLE 1 % EX GEL: Freq: Two times a day (BID) | CUTANEOUS | 2 refills | Status: DC

## 2018-01-22 NOTE — Patient Instructions (Signed)
We will call about physical time  Use topical on face twice a day  Nails paint daily and remove in 7 days with alcohol

## 2018-01-22 NOTE — Progress Notes (Signed)
   Subjective:    Patient ID: Shane Gonzalez, male    DOB: Aug 04, 1956, 62 y.o.   MRN: [id number]  Patient presents for skin irritation (on face and fungus on toes)  Pt here with rash on face has been present for months, mostly redness but gets pimples on nose and on cheeks  Has not tried anything on his skin, he does pick at the lesions   toenail fungus, tried the vapor rub but no change wants to try topical   Has itchy spot on calf, not sure if a bug bite, unknown how long it has been present   Over due for physical, he lost insurance for a year   Review Of Systems:  GEN- denies fatigue, fever, weight loss,weakness, recent illness HEENT- denies eye drainage, change in vision, nasal discharge, CVS- denies chest pain, palpitations RESP- denies SOB, cough, wheeze ABD- denies N/V, change in stools, abd pain GU- denies dysuria, hematuria, dribbling, incontinence MSK- denies joint pain, muscle aches, injury Neuro- denies headache, dizziness, syncope, seizure activity       Objective:    BP 122/80   Pulse 79   Temp 98.1 F (36.7 C) (Oral)   Resp 14   Ht 5' 9.25" (1.759 m)   Wt 245 lb 9.6 oz (111.4 kg)   SpO2 97%   BMI 36.01 kg/m  GEN- NAD, alert and oriented x3 HEENT- PERRL, EOMI, non injected sclera, pink conjunctiva, MMM, oropharynx clear Neck- Supple, LAD Skin erythema bilat cheeks, nose, in mustache area with papules scattered bilat cheeks, few scaley spots, pustules Right post thigh- small erythematous scaley macular lesion- NT size of eraser head  bilat great toenails yellow very thickened, yellowing  On corners most of other toenails, some brittle  Ext- no edema, pulse 2+         Assessment & Plan:      Problem List Items Addressed This Visit    None    Visit Diagnoses    Rosacea    -  Primary   Treat with metrogel BID see how he responeds, recommend against picking at skin    Toenail fungus       topical penlac, discussed use, will need > 6 months. Other  non specific spot, can try hydrocortisone first    Relevant Medications   ciclopirox (PENLAC) 8 % solution      Note: This dictation was prepared with Dragon dictation along with smaller phrase technology. Any transcriptional errors that result from this process are unintentional.

## 2018-02-08 ENCOUNTER — Other Ambulatory Visit: Payer: Self-pay

## 2018-02-08 ENCOUNTER — Encounter: Payer: Self-pay | Admitting: Family Medicine

## 2018-02-08 ENCOUNTER — Ambulatory Visit (INDEPENDENT_AMBULATORY_CARE_PROVIDER_SITE_OTHER): Payer: BLUE CROSS/BLUE SHIELD | Admitting: Family Medicine

## 2018-02-08 VITALS — BP 122/62 | HR 82 | Temp 98.5°F | Resp 14 | Ht 69.25 in | Wt 242.0 lb

## 2018-02-08 DIAGNOSIS — N529 Male erectile dysfunction, unspecified: Secondary | ICD-10-CM | POA: Diagnosis not present

## 2018-02-08 DIAGNOSIS — E782 Mixed hyperlipidemia: Secondary | ICD-10-CM | POA: Diagnosis not present

## 2018-02-08 DIAGNOSIS — D489 Neoplasm of uncertain behavior, unspecified: Secondary | ICD-10-CM

## 2018-02-08 DIAGNOSIS — Z125 Encounter for screening for malignant neoplasm of prostate: Secondary | ICD-10-CM | POA: Diagnosis not present

## 2018-02-08 DIAGNOSIS — Z0000 Encounter for general adult medical examination without abnormal findings: Secondary | ICD-10-CM | POA: Diagnosis not present

## 2018-02-08 DIAGNOSIS — Z8042 Family history of malignant neoplasm of prostate: Secondary | ICD-10-CM | POA: Diagnosis not present

## 2018-02-08 DIAGNOSIS — L308 Other specified dermatitis: Secondary | ICD-10-CM | POA: Diagnosis not present

## 2018-02-08 NOTE — Assessment & Plan Note (Signed)
CPE done, fasting labs, check testotserone, PSA. Punch biopsy of skin lesion done.

## 2018-02-08 NOTE — Progress Notes (Signed)
   Subjective:    Patient ID: Shane Gonzalez, male    DOB: 02/07/1956, 62 y.o.   MRN: [id number]  Patient presents for CPE (is fasting)  Pt here for CPE  Medications reviewed  Rosacea has improved with metrogel   Continues to have decreased liido, difficulty with erections, can not keep sometimes or cant get an erection at all. He does get premature ejaculation, This has been worsening over the past year.    Spot on back right calf continue to have itchy spot no response to the hydrocortisone   Due for shingles vaccine Due for fasting labs   Family and medical history reviewed       Review Of Systems:  GEN- denies fatigue, fever, weight loss,weakness, recent illness HEENT- denies eye drainage, change in vision, nasal discharge, CVS- denies chest pain, palpitations RESP- denies SOB, cough, wheeze ABD- denies N/V, change in stools, abd pain GU- denies dysuria, hematuria, dribbling, incontinence MSK- denies joint pain, muscle aches, injury Neuro- denies headache, dizziness, syncope, seizure activity       Objective:    BP 122/62   Pulse 82   Temp 98.5 F (36.9 C) (Oral)   Resp 14   Ht 5' 9.25" (1.759 m)   Wt 242 lb (109.8 kg)   SpO2 98%   BMI 35.48 kg/m  GEN- NAD, alert and oriented x3 HEENT- PERRL, EOMI, non injected sclera, pink conjunctiva, MMM, oropharynx clear Neck- Supple, no thyromegaly CVS- RRR, no murmur RESP-CTAB ABD-NABS,soft,NT,ND GU- rectum normal tone, mild prostate enlargement , no nodules, FOBT neg  Right post thigh- small erythematous scaley macular lesion- NT size of eraser head  Skin- Right post thigh    Procedure- Punch biopsy  Procedure explained to patient questions answered benefits and risks discussed verbal consent obtained. Antiseptic-betadine  Anesthesia-lidocaine 1% with Epi  26mm Punch Biopsy  Minimal blood loss,  Patient tolerated procedure well Bandage applied            Assessment & Plan:      Problem List Items  Addressed This Visit      Unprioritized   Prostate cancer screening   Relevant Orders   PSA   Family history of prostate cancer   Hyperlipidemia   Routine general medical examination at a health care facility - Primary    CPE done, fasting labs, check testotserone, PSA. Punch biopsy of skin lesion done.       Relevant Orders   CBC with Differential/Platelet   Comprehensive metabolic panel   Lipid panel    Other Visit Diagnoses    Neoplasm of uncertain behavior       Relevant Orders   Pathology Report   Erectile dysfunction, unspecified erectile dysfunction type       Relevant Orders   Testosterone             Assessment & Plan:       Note: This dictation was prepared with Dragon dictation along with smaller phrase technology. Any transcriptional errors that result from this process are unintentional.

## 2018-02-08 NOTE — Patient Instructions (Addendum)
We will call with lab results F/U 1 year for Physical  Referral to dermatology

## 2018-02-09 LAB — COMPREHENSIVE METABOLIC PANEL
AG RATIO: 1.9 (calc) (ref 1.0–2.5)
ALT: 41 U/L (ref 9–46)
AST: 31 U/L (ref 10–35)
Albumin: 4.9 g/dL (ref 3.6–5.1)
Alkaline phosphatase (APISO): 68 U/L (ref 40–115)
BUN: 21 mg/dL (ref 7–25)
CHLORIDE: 101 mmol/L (ref 98–110)
CO2: 27 mmol/L (ref 20–32)
CREATININE: 0.93 mg/dL (ref 0.70–1.33)
Calcium: 9.8 mg/dL (ref 8.6–10.3)
GLOBULIN: 2.6 g/dL (ref 1.9–3.7)
GLUCOSE: 93 mg/dL (ref 65–99)
Potassium: 4.8 mmol/L (ref 3.5–5.3)
SODIUM: 138 mmol/L (ref 135–146)
TOTAL PROTEIN: 7.5 g/dL (ref 6.1–8.1)
Total Bilirubin: 0.6 mg/dL (ref 0.2–1.2)

## 2018-02-09 LAB — CBC WITH DIFFERENTIAL/PLATELET
BASOS PCT: 0.2 %
Basophils Absolute: 9 cells/uL (ref 0–200)
EOS PCT: 1.8 %
Eosinophils Absolute: 79 cells/uL (ref 15–500)
HCT: 44.2 % (ref 38.5–50.0)
HEMOGLOBIN: 15.4 g/dL (ref 13.2–17.1)
Lymphs Abs: 1294 cells/uL (ref 850–3900)
MCH: 31.6 pg (ref 27.0–33.0)
MCHC: 34.8 g/dL (ref 32.0–36.0)
MCV: 90.6 fL (ref 80.0–100.0)
MONOS PCT: 8.7 %
MPV: 10.4 fL (ref 7.5–12.5)
NEUTROS ABS: 2636 {cells}/uL (ref 1500–7800)
Neutrophils Relative %: 59.9 %
PLATELETS: 141 10*3/uL (ref 140–400)
RBC: 4.88 10*6/uL (ref 4.20–5.80)
RDW: 12.8 % (ref 11.0–15.0)
TOTAL LYMPHOCYTE: 29.4 %
WBC mixed population: 383 cells/uL (ref 200–950)
WBC: 4.4 10*3/uL (ref 3.8–10.8)

## 2018-02-09 LAB — LIPID PANEL
CHOL/HDL RATIO: 3.6 (calc) (ref <5.0)
Cholesterol: 217 mg/dL — ABNORMAL HIGH (ref <200)
HDL: 60 mg/dL (ref >40)
LDL Cholesterol (Calc): 140 mg/dL (calc) — ABNORMAL HIGH
NON-HDL CHOLESTEROL (CALC): 157 mg/dL — AB (ref <130)
TRIGLYCERIDES: 71 mg/dL (ref <150)

## 2018-02-09 LAB — PSA: PSA: 0.3 ng/mL (ref <=4.0)

## 2018-02-09 LAB — TESTOSTERONE: Testosterone: 650 ng/dL (ref 250–827)

## 2018-02-13 ENCOUNTER — Other Ambulatory Visit: Payer: Self-pay | Admitting: *Deleted

## 2018-02-13 LAB — TISSUE SPECIMEN

## 2018-02-13 LAB — PATHOLOGY

## 2018-02-13 MED ORDER — SILDENAFIL CITRATE 100 MG PO TABS
50.0000 mg | ORAL_TABLET | Freq: Every day | ORAL | 11 refills | Status: DC | PRN
Start: 2018-02-13 — End: 2019-02-12

## 2018-02-14 ENCOUNTER — Other Ambulatory Visit: Payer: Self-pay | Admitting: *Deleted

## 2018-02-14 MED ORDER — TRIAMCINOLONE ACETONIDE 0.1 % EX CREA: 1.0000 "application " | TOPICAL_CREAM | Freq: Two times a day (BID) | CUTANEOUS | 1 refills | Status: DC

## 2018-05-08 ENCOUNTER — Encounter: Payer: BLUE CROSS/BLUE SHIELD | Admitting: Family Medicine

## 2018-11-29 ENCOUNTER — Other Ambulatory Visit: Payer: Self-pay

## 2018-11-29 ENCOUNTER — Ambulatory Visit (INDEPENDENT_AMBULATORY_CARE_PROVIDER_SITE_OTHER): Payer: BLUE CROSS/BLUE SHIELD | Admitting: Family Medicine

## 2018-11-29 ENCOUNTER — Encounter: Payer: Self-pay | Admitting: Family Medicine

## 2018-11-29 VITALS — BP 134/72 | HR 66 | Temp 98.5°F | Resp 16 | Ht 69.25 in | Wt 251.0 lb

## 2018-11-29 DIAGNOSIS — J209 Acute bronchitis, unspecified: Secondary | ICD-10-CM

## 2018-11-29 MED ORDER — AZITHROMYCIN 250 MG PO TABS: ORAL_TABLET | ORAL | 0 refills | Status: DC

## 2018-11-29 MED ORDER — GUAIFENESIN ER 600 MG PO TB12: 600.0000 mg | ORAL_TABLET | Freq: Two times a day (BID) | ORAL | 0 refills | Status: AC

## 2018-11-29 MED ORDER — IPRATROPIUM-ALBUTEROL 0.5-2.5 (3) MG/3ML IN SOLN: 3.0000 mL | Freq: Once | RESPIRATORY_TRACT | Status: DC

## 2018-11-29 MED ORDER — BENZONATATE 100 MG PO CAPS: 100.0000 mg | ORAL_CAPSULE | Freq: Three times a day (TID) | ORAL | 0 refills | Status: DC | PRN

## 2018-11-29 MED ORDER — ALBUTEROL SULFATE 108 (90 BASE) MCG/ACT IN AEPB: 2.0000 | INHALATION_SPRAY | RESPIRATORY_TRACT | 1 refills | Status: DC | PRN

## 2018-11-29 MED ORDER — PREDNISONE 20 MG PO TABS: 40.0000 mg | ORAL_TABLET | Freq: Every day | ORAL | 0 refills | Status: AC

## 2018-11-29 NOTE — Progress Notes (Signed)
Patient ID: Shane Gonzalez, male    DOB: 1956/02/24, 62 y.o.   MRN: [id number]  PCP: Alycia Rossetti, MD  Chief Complaint  Patient presents with  . Illness    x2 weeks- nonproductive cough, wheezing, nasal drainage    Subjective:   Shane Gonzalez is a 62 y.o. male, presents to clinic with CC of head cold and chest cold for 2 weeks, gradually worsening.  He reports productive cough with green sputum, becoming more frequent, with coughing fits that make his head hurt, also endorses audible wheeze and rattling with his breathing.  He is concerned for "walking pneumonia."  He does continue to have some nasal and sinus congestion and discharge.  He denies any sore throat, neck pain, fevers, hot or cold chills, night sweats, unintentional weight loss, chest pain, exertional shortness of breath, fatigue, palpitations, lower extremity edema.  No hx of asthma, lung disease or bronchitis.    Patient Active Problem List   Diagnosis Date Noted  . Family history of prostate cancer 04/04/2013  . Routine general medical examination at a health care facility 11/13/2011  . Prostate cancer screening 11/13/2011  . Hyperlipidemia 08/17/2008  . DIVERTICULITIS, HX OF 08/04/2008     Prior to Admission medications   Medication Sig Start Date End Date Taking? Authorizing Provider  ciclopirox (PENLAC) 8 % solution Apply topically at bedtime. Apply over nail and surrounding skin.After seven (7) days, Shane remove with alcohol and continue cycle. 01/22/18  Yes [place], Modena Nunnery, MD  metroNIDAZOLE (METROGEL) 1 % gel Apply topically 2 (two) times daily. To affected areas on skin 01/22/18  Yes [place], Modena Nunnery, MD  sildenafil (VIAGRA) 100 MG tablet Take 0.5-1 tablets (50-100 mg total) by mouth daily as needed for erectile dysfunction. 02/13/18  Yes [place], Modena Nunnery, MD  triamcinolone cream (KENALOG) 0.1 % Apply 1 application topically 2 (two) times daily. 02/14/18  Yes [place], Modena Nunnery, MD     No Known  Allergies   Family History  Problem Relation Age of Onset  . Cancer Mother        ? pancreatic  . Stroke Mother   . Vision loss Mother   . Cancer Father        lung ca  . Hypertension Father   . Vision loss Father   . Diverticulitis Sister   . Prostate cancer Brother   . Alcohol abuse Brother   . Cancer Brother   . Diverticulitis Brother        x2 brothers  . Heart disease Maternal Grandfather   . Alcohol abuse Brother   . Diverticulitis Brother   . Cancer Paternal Aunt   . Cancer Paternal Uncle      Social History   Socioeconomic History  . Marital status: Married    Spouse name: Not on file  . Number of children: Not on file  . Years of education: Not on file  . Highest education level: Not on file  Occupational History  . Not on file  Social Needs  . Financial resource strain: Not on file  . Food insecurity:    Worry: Not on file    Inability: Not on file  . Transportation needs:    Medical: Not on file    Non-medical: Not on file  Tobacco Use  . Smoking status: Current Some Day Smoker    Types: Cigars  . Smokeless tobacco: Never Used  . Tobacco comment: only smokes once yearly  Substance and Sexual Activity  .  Alcohol use: Yes    Alcohol/week: 0.0 standard drinks    Comment: weekends 4 beers  . Drug use: No  . Sexual activity: Yes    Birth control/protection: Surgical  Lifestyle  . Physical activity:    Days per week: Not on file    Minutes per session: Not on file  . Stress: Not on file  Relationships  . Social connections:    Talks on phone: Not on file    Gets together: Not on file    Attends religious service: Not on file    Active member of club or organization: Not on file    Attends meetings of clubs or organizations: Not on file    Relationship status: Not on file  . Intimate partner violence:    Fear of current or ex partner: Not on file    Emotionally abused: Not on file    Physically abused: Not on file    Forced sexual  activity: Not on file  Other Topics Concern  . Not on file  Social History Narrative  . Not on file     Review of Systems  Constitutional: Negative.  Negative for activity change, appetite change, chills, diaphoresis, fatigue and fever.  HENT: Negative.   Eyes: Negative.   Respiratory: Positive for cough and wheezing. Negative for apnea, choking, chest tightness, shortness of breath and stridor.   Cardiovascular: Negative.  Negative for chest pain, palpitations and leg swelling.  Gastrointestinal: Negative.   Endocrine: Negative.   Genitourinary: Negative.   Musculoskeletal: Negative.   Skin: Negative.   Allergic/Immunologic: Negative.   Neurological: Negative.   Hematological: Negative.   Psychiatric/Behavioral: Negative.   All other systems reviewed and are negative.      Objective:    Vitals:   11/29/18 0816  BP: 134/72  Pulse: 66  Resp: 16  Temp: 98.5 F (36.9 C)  TempSrc: Oral  SpO2: 96%  Weight: 251 lb (113.9 kg)  Height: 5' 9.25" (1.759 m)      Physical Exam Vitals signs and nursing note reviewed.  Constitutional:      General: He is not in acute distress.    Appearance: Normal appearance. He is well-developed. He is not ill-appearing, toxic-appearing or diaphoretic.  HENT:     Head: Normocephalic and atraumatic.     Jaw: No trismus.     Right Ear: Tympanic membrane, ear canal and external ear normal.     Left Ear: Tympanic membrane, ear canal and external ear normal.     Nose: Mucosal edema and rhinorrhea present.     Right Sinus: No maxillary sinus tenderness or frontal sinus tenderness.     Left Sinus: No maxillary sinus tenderness or frontal sinus tenderness.     Mouth/Throat:     Mouth: Mucous membranes are not pale, not dry and not cyanotic.     Pharynx: Uvula midline. Posterior oropharyngeal erythema present. No oropharyngeal exudate or uvula swelling.     Tonsils: No tonsillar exudate or tonsillar abscesses.  Eyes:     General: Lids are  normal.        Right eye: No discharge.        Left eye: No discharge.     Conjunctiva/sclera: Conjunctivae normal.     Pupils: Pupils are equal, round, and reactive to light.  Neck:     Musculoskeletal: Normal range of motion and neck supple.     Trachea: Trachea and phonation normal. No tracheal deviation.  Cardiovascular:     Rate  and Rhythm: Normal rate and regular rhythm.     Pulses:          Radial pulses are 2+ on the right side and 2+ on the left side.     Heart sounds: Normal heart sounds. No murmur. No friction rub. No gallop.   Pulmonary:     Effort: Pulmonary effort is normal. No tachypnea, accessory muscle usage or respiratory distress.     Breath sounds: No stridor. Wheezing and rhonchi present. No decreased breath sounds or rales.  Abdominal:     General: Bowel sounds are normal. There is no distension.     Palpations: Abdomen is soft.     Tenderness: There is no abdominal tenderness.  Musculoskeletal: Normal range of motion.  Skin:    General: Skin is warm and dry.     Capillary Refill: Capillary refill takes less than 2 seconds.     Coloration: Skin is not pale.     Findings: No rash.     Nails: There is no clubbing.   Neurological:     Mental Status: He is alert and oriented to person, place, and time.     Motor: No abnormal muscle tone.     Coordination: Coordination normal.     Gait: Gait normal.  Psychiatric:        Speech: Speech normal.        Behavior: Behavior normal. Behavior is cooperative.           Assessment & Plan:      ICD-10-CM   1. Acute bronchitis, unspecified organism J20.9 Albuterol Sulfate 108 (90 Base) MCG/ACT AEPB    benzonatate (TESSALON) 100 MG capsule    guaiFENesin (MUCINEX) 600 MG 12 hr tablet    predniSONE (DELTASONE) 20 MG tablet    ipratropium-albuterol (DUONEB) 0.5-2.5 (3) MG/3ML nebulizer solution 3 mL    Patient was reexamined after DuoNeb his breath sounds improved he did have some continued expiratory wheeze in  mid to upper lung fields bilaterally and some persisting diminished breath sounds to the right lower lung fields will treat for possible community-acquired pneumonia with Z-Pak in addition to treatment for acute bronchitis, encourage patient to follow-up if not improving in the next 1 to 2 weeks.  Would hope that his symptoms and gradually improve but if he does not Shane need to reexamine and or obtain chest x-ray.  Educated pt on how to use inhaler.  His coughing was less frequent, audible wheeze and rattling resolved prior to leaving clinic, he left in good condition with no increase WOB.     Delsa Grana, PA-C 11/29/18 8:54 AM

## 2019-02-12 ENCOUNTER — Encounter: Payer: Self-pay | Admitting: Family Medicine

## 2019-02-12 ENCOUNTER — Other Ambulatory Visit: Payer: Self-pay

## 2019-02-12 ENCOUNTER — Ambulatory Visit (INDEPENDENT_AMBULATORY_CARE_PROVIDER_SITE_OTHER): Payer: Commercial Managed Care - PPO | Admitting: Family Medicine

## 2019-02-12 VITALS — BP 130/72 | HR 82 | Temp 98.4°F | Resp 12 | Ht 69.25 in | Wt 244.0 lb

## 2019-02-12 DIAGNOSIS — Z8042 Family history of malignant neoplasm of prostate: Secondary | ICD-10-CM

## 2019-02-12 DIAGNOSIS — Z125 Encounter for screening for malignant neoplasm of prostate: Secondary | ICD-10-CM

## 2019-02-12 DIAGNOSIS — Z79899 Other long term (current) drug therapy: Secondary | ICD-10-CM

## 2019-02-12 DIAGNOSIS — Z0000 Encounter for general adult medical examination without abnormal findings: Secondary | ICD-10-CM | POA: Diagnosis not present

## 2019-02-12 DIAGNOSIS — E782 Mixed hyperlipidemia: Secondary | ICD-10-CM

## 2019-02-12 DIAGNOSIS — B351 Tinea unguium: Secondary | ICD-10-CM

## 2019-02-12 MED ORDER — TERBINAFINE HCL 250 MG PO TABS: 250.0000 mg | ORAL_TABLET | Freq: Every day | ORAL | 1 refills | Status: DC

## 2019-02-12 NOTE — Assessment & Plan Note (Addendum)
CPE done, fasting labs to be done Form to be completed for work psa screening, familyhistory  Colonoscopy UTD Shingrix sent to pharmacy

## 2019-02-12 NOTE — Progress Notes (Signed)
   Subjective:    Patient ID: Shane Gonzalez, male    DOB: 20-Nov-1956, 63 y.o.   MRN: [id number]  Patient presents for Annual Exam (is fasting- has wellness forms)  Pt here for CPE   Toenail fungus - wants to try oral medication, penlac has not help    Obesity- He was been trying to work on dietary changes    Follows with dentist- no major concerns  Will schedule eye visit    Rosacea- uses metrogel as needed   History reviewed    Review Of Systems:  GEN- denies fatigue, fever, weight loss,weakness, recent illness HEENT- denies eye drainage, change in vision, nasal discharge, CVS- denies chest pain, palpitations RESP- denies SOB, cough, wheeze ABD- denies N/V, change in stools, abd pain GU- denies dysuria, hematuria, dribbling, incontinence MSK- denies joint pain, muscle aches, injury Neuro- denies headache, dizziness, syncope, seizure activity       Objective:    BP 130/72   Pulse 82   Temp 98.4 F (36.9 C) (Oral)   Resp 12   Ht 5' 9.25" (1.759 m)   Wt 244 lb (110.7 kg)   SpO2 98%   BMI 35.77 kg/m  GEN- NAD, alert and oriented x3 HEENT- PERRL, EOMI, non injected sclera, pink conjunctiva, MMM, oropharynx clear Neck- Supple, no thyromegaly CVS- RRR, no murmur RESP-CTAB ABD-NABS,soft,NT,ND GU- normal rectal tone, prostate smooth no nodules, no enlargement EXT- No edema , skin yellow thick great toes nails bilat , 3rd digit left foot Pulses- Radial, DP- 2+        Assessment & Plan:      Problem List Items Addressed This Visit      Unprioritized   Family history of prostate cancer   Hyperlipidemia   Relevant Orders   Lipid panel   Prostate cancer screening   Relevant Orders   PSA   Routine general medical examination at a health care facility - Primary    CPE done, fasting labs to be done Form to be completed for work psa screening, familyhistory  Colonoscopy UTD Shingrix sent to pharmacy       Relevant Orders   CBC with Differential/Platelet   Comprehensive metabolic panel   Lipid panel   Toenail fungus    Trial of terbinafine, recheck LFT in 6 weeks      Relevant Medications   terbinafine (LAMISIL) 250 MG tablet   Other Relevant Orders   Comprehensive metabolic panel    Other Visit Diagnoses    Long-term use of high-risk medication       Relevant Orders   Comprehensive metabolic panel      Note: This dictation was prepared with Dragon dictation along with smaller phrase technology. Any transcriptional errors that result from this process are unintentional.

## 2019-02-12 NOTE — Assessment & Plan Note (Signed)
Trial of terbinafine, recheck LFT in 6 weeks

## 2019-02-12 NOTE — Patient Instructions (Addendum)
Start the terbinafine shingrix sent to pharmacy Schedule eye visit  F/U 6 weeks for labs for liver

## 2019-02-13 LAB — COMPREHENSIVE METABOLIC PANEL
AG RATIO: 2.1 (calc) (ref 1.0–2.5)
ALBUMIN MSPROF: 5.1 g/dL (ref 3.6–5.1)
ALT: 34 U/L (ref 9–46)
AST: 26 U/L (ref 10–35)
Alkaline phosphatase (APISO): 61 U/L (ref 35–144)
BILIRUBIN TOTAL: 0.5 mg/dL (ref 0.2–1.2)
BUN: 20 mg/dL (ref 7–25)
CALCIUM: 9.9 mg/dL (ref 8.6–10.3)
CO2: 27 mmol/L (ref 20–32)
Chloride: 105 mmol/L (ref 98–110)
Creat: 0.92 mg/dL (ref 0.70–1.25)
Globulin: 2.4 g/dL (calc) (ref 1.9–3.7)
Glucose, Bld: 90 mg/dL (ref 65–99)
POTASSIUM: 5.1 mmol/L (ref 3.5–5.3)
SODIUM: 141 mmol/L (ref 135–146)
TOTAL PROTEIN: 7.5 g/dL (ref 6.1–8.1)

## 2019-02-13 LAB — LIPID PANEL
Cholesterol: 213 mg/dL — ABNORMAL HIGH (ref <200)
HDL: 69 mg/dL (ref >=40)
LDL CHOLESTEROL (CALC): 127 mg/dL — AB
NON-HDL CHOLESTEROL (CALC): 144 mg/dL — AB (ref <130)
Total CHOL/HDL Ratio: 3.1 (calc) (ref <5.0)
Triglycerides: 73 mg/dL (ref <150)

## 2019-02-13 LAB — CBC WITH DIFFERENTIAL/PLATELET
Absolute Monocytes: 350 cells/uL (ref 200–950)
BASOS PCT: 0.2 %
Basophils Absolute: 10 cells/uL (ref 0–200)
EOS PCT: 1.9 %
Eosinophils Absolute: 91 cells/uL (ref 15–500)
HCT: 44.9 % (ref 38.5–50.0)
HEMOGLOBIN: 15.4 g/dL (ref 13.2–17.1)
Lymphs Abs: 1421 cells/uL (ref 850–3900)
MCH: 31.9 pg (ref 27.0–33.0)
MCHC: 34.3 g/dL (ref 32.0–36.0)
MCV: 93 fL (ref 80.0–100.0)
MPV: 10.3 fL (ref 7.5–12.5)
Monocytes Relative: 7.3 %
NEUTROS ABS: 2928 {cells}/uL (ref 1500–7800)
Neutrophils Relative %: 61 %
Platelets: 222 10*3/uL (ref 140–400)
RBC: 4.83 10*6/uL (ref 4.20–5.80)
RDW: 12.9 % (ref 11.0–15.0)
Total Lymphocyte: 29.6 %
WBC: 4.8 10*3/uL (ref 3.8–10.8)

## 2019-02-13 LAB — PSA: PSA: 0.5 ng/mL (ref <=4.0)

## 2019-02-26 ENCOUNTER — Encounter: Payer: Self-pay | Admitting: *Deleted

## 2019-03-26 ENCOUNTER — Other Ambulatory Visit: Payer: Commercial Managed Care - PPO

## 2019-03-26 ENCOUNTER — Other Ambulatory Visit: Payer: Self-pay

## 2019-03-26 DIAGNOSIS — B351 Tinea unguium: Secondary | ICD-10-CM

## 2019-03-26 DIAGNOSIS — Z79899 Other long term (current) drug therapy: Secondary | ICD-10-CM

## 2019-03-27 ENCOUNTER — Other Ambulatory Visit: Payer: Self-pay | Admitting: *Deleted

## 2019-03-27 LAB — COMPREHENSIVE METABOLIC PANEL
AG Ratio: 2 (calc) (ref 1.0–2.5)
ALT: 27 U/L (ref 9–46)
AST: 24 U/L (ref 10–35)
Albumin: 4.7 g/dL (ref 3.6–5.1)
Alkaline phosphatase (APISO): 58 U/L (ref 35–144)
BUN: 25 mg/dL (ref 7–25)
CO2: 29 mmol/L (ref 20–32)
Calcium: 10 mg/dL (ref 8.6–10.3)
Chloride: 106 mmol/L (ref 98–110)
Creat: 1.03 mg/dL (ref 0.70–1.25)
Globulin: 2.3 g/dL (calc) (ref 1.9–3.7)
Glucose, Bld: 94 mg/dL (ref 65–99)
Potassium: 5.3 mmol/L (ref 3.5–5.3)
Sodium: 141 mmol/L (ref 135–146)
Total Bilirubin: 0.4 mg/dL (ref 0.2–1.2)
Total Protein: 7 g/dL (ref 6.1–8.1)

## 2019-03-27 LAB — EXTRA LAV TOP TUBE

## 2019-03-27 MED ORDER — TERBINAFINE HCL 250 MG PO TABS: 250.0000 mg | ORAL_TABLET | Freq: Every day | ORAL | 1 refills | Status: DC

## 2019-12-11 ENCOUNTER — Inpatient Hospital Stay (HOSPITAL_COMMUNITY)
Admission: EM | Admit: 2019-12-11 | Discharge: 2019-12-15 | DRG: 177 | Disposition: A | Discharge status: Home / Self Care | Payer: Commercial Managed Care - PPO | Attending: Internal Medicine | Admitting: Internal Medicine

## 2019-12-11 ENCOUNTER — Other Ambulatory Visit: Payer: Self-pay

## 2019-12-11 ENCOUNTER — Encounter (HOSPITAL_COMMUNITY): Payer: Self-pay | Admitting: Emergency Medicine

## 2019-12-11 ENCOUNTER — Emergency Department (HOSPITAL_COMMUNITY): Payer: Commercial Managed Care - PPO

## 2019-12-11 DIAGNOSIS — R059 Cough, unspecified: Secondary | ICD-10-CM

## 2019-12-11 DIAGNOSIS — D708 Other neutropenia: Secondary | ICD-10-CM

## 2019-12-11 DIAGNOSIS — E785 Hyperlipidemia, unspecified: Secondary | ICD-10-CM | POA: Diagnosis present

## 2019-12-11 DIAGNOSIS — R05 Cough: Secondary | ICD-10-CM

## 2019-12-11 DIAGNOSIS — F17290 Nicotine dependence, other tobacco product, uncomplicated: Secondary | ICD-10-CM | POA: Diagnosis present

## 2019-12-11 DIAGNOSIS — R945 Abnormal results of liver function studies: Secondary | ICD-10-CM

## 2019-12-11 DIAGNOSIS — Z8616 Personal history of COVID-19: Secondary | ICD-10-CM | POA: Diagnosis present

## 2019-12-11 DIAGNOSIS — J9601 Acute respiratory failure with hypoxia: Secondary | ICD-10-CM | POA: Diagnosis present

## 2019-12-11 DIAGNOSIS — K760 Fatty (change of) liver, not elsewhere classified: Secondary | ICD-10-CM | POA: Diagnosis present

## 2019-12-11 DIAGNOSIS — I480 Paroxysmal atrial fibrillation: Secondary | ICD-10-CM | POA: Diagnosis not present

## 2019-12-11 DIAGNOSIS — K219 Gastro-esophageal reflux disease without esophagitis: Secondary | ICD-10-CM | POA: Diagnosis present

## 2019-12-11 DIAGNOSIS — J1282 Pneumonia due to coronavirus disease 2019: Secondary | ICD-10-CM

## 2019-12-11 DIAGNOSIS — Z7289 Other problems related to lifestyle: Secondary | ICD-10-CM

## 2019-12-11 DIAGNOSIS — U071 COVID-19: Secondary | ICD-10-CM

## 2019-12-11 DIAGNOSIS — R7401 Elevation of levels of liver transaminase levels: Secondary | ICD-10-CM

## 2019-12-11 DIAGNOSIS — I4891 Unspecified atrial fibrillation: Secondary | ICD-10-CM

## 2019-12-11 DIAGNOSIS — Z8249 Family history of ischemic heart disease and other diseases of the circulatory system: Secondary | ICD-10-CM

## 2019-12-11 DIAGNOSIS — Z79899 Other long term (current) drug therapy: Secondary | ICD-10-CM

## 2019-12-11 DIAGNOSIS — Z8042 Family history of malignant neoplasm of prostate: Secondary | ICD-10-CM

## 2019-12-11 DIAGNOSIS — E871 Hypo-osmolality and hyponatremia: Secondary | ICD-10-CM

## 2019-12-11 DIAGNOSIS — R0902 Hypoxemia: Secondary | ICD-10-CM

## 2019-12-11 DIAGNOSIS — Z811 Family history of alcohol abuse and dependence: Secondary | ICD-10-CM

## 2019-12-11 MED ORDER — KETOROLAC TROMETHAMINE 30 MG/ML IJ SOLN
15.0000 mg | Freq: Once | INTRAMUSCULAR | Status: AC
  Administered 2019-12-12: 15 mg via INTRAVENOUS
  Filled 2019-12-11: qty 1

## 2019-12-11 MED ORDER — SODIUM CHLORIDE 0.9 % IV BOLUS
1000.0000 mL | Freq: Once | INTRAVENOUS | Status: AC
  Administered 2019-12-12: 1000 mL via INTRAVENOUS

## 2019-12-11 MED ORDER — ACETAMINOPHEN 325 MG PO TABS
650.0000 mg | ORAL_TABLET | Freq: Once | ORAL | Status: AC
  Administered 2019-12-11: 650 mg via ORAL
  Filled 2019-12-11: qty 2

## 2019-12-11 NOTE — ED Provider Notes (Signed)
[facility] EMERGENCY DEPARTMENT Provider Note   CSN: [id number] Arrival date & time: 12/11/19  1845     History No chief complaint on file.   [place] Griffin is a 64 y.o. male.  HPI    Patient with known Covid positive status presents with cough, dyspnea, soreness, fever. Onset of symptoms was about 10 days ago, and positive result was 3 days ago.  He notes that in spite of taking his prescribed medication, including steroids, Tessalon Perles, doxycycline, Mucinex and Tylenol he continues to have diffuse soreness, substantial chest wall tenderness, particularly with coughing, and ongoing cough. There is associated dyspnea persistently.  It seems as though fever improves with Tylenol, symptoms improve only transiently with the other medication. No nausea, vomiting, diarrhea. Patient's wife is also sick, though he notes that her symptoms are milder. Patient denies history of multiple medical issues, states that he is generally well.  He does not smoke, does not drink.  Past Medical History:  Diagnosis Date  . Bilateral bunions    both feet  . Blood in stool    Hx of  . Diverticulosis    Hx of  . GERD (gastroesophageal reflux disease)   . Hyperlipidemia   . Toe deformity    2nd  . Toenail fungus    rt big toe    Patient Active Problem List   Diagnosis Date Noted  . Toenail fungus 02/12/2019  . Family history of prostate cancer 04/04/2013  . Routine general medical examination at a health care facility 11/13/2011  . Prostate cancer screening 11/13/2011  . Hyperlipidemia 08/17/2008  . DIVERTICULITIS, HX OF 08/04/2008    Past Surgical History:  Procedure Laterality Date  . ANTERIOR CRUCIATE LIGAMENT REPAIR    . BUNIONECTOMY  2011  . CLOSED REDUCTION SHOULDER DISLOCATION     left reduced under anesthesia  . hammer toe repair  2011  . REFRACTIVE SURGERY  2009  . SHOULDER SURGERY     right  . TENDON REPAIR  2011  . VASECTOMY    . WISDOM TOOTH  EXTRACTION  2011       Family History  Problem Relation Age of Onset  . Cancer Mother        ? pancreatic  . Stroke Mother   . Vision loss Mother   . Cancer Father        lung ca  . Hypertension Father   . Vision loss Father   . Diverticulitis Sister   . Prostate cancer Brother   . Alcohol abuse Brother   . Cancer Brother   . Diverticulitis Brother        x2 brothers  . Heart disease Maternal Grandfather   . Alcohol abuse Brother   . Diverticulitis Brother   . Cancer Paternal Aunt   . Cancer Paternal Uncle     Social History   Tobacco Use  . Smoking status: Current Some Day Smoker    Types: Cigars  . Smokeless tobacco: Never Used  . Tobacco comment: only smokes once yearly  Substance Use Topics  . Alcohol use: Yes    Alcohol/week: 0.0 standard drinks    Comment: weekends 4 beers  . Drug use: No    Home Medications Prior to Admission medications   Medication Sig Start Date End Date Taking? Authorizing Provider  metroNIDAZOLE (METROGEL) 1 % gel Apply topically 2 (two) times daily. To affected areas on skin 01/22/18   Alycia Rossetti, MD  terbinafine (LAMISIL) 250 MG tablet  Take 1 tablet (250 mg total) by mouth daily. 03/27/19   Alycia Rossetti, MD  triamcinolone cream (KENALOG) 0.1 % Apply 1 application topically 2 (two) times daily. 02/14/18   Alycia Rossetti, MD    Allergies    Patient has no known allergies.  Review of Systems   Review of Systems  Constitutional:       Per HPI, otherwise negative  HENT:       Per HPI, otherwise negative  Respiratory:       Per HPI, otherwise negative  Cardiovascular:       Per HPI, otherwise negative  Gastrointestinal: Negative for vomiting.  Endocrine:       Negative aside from HPI  Genitourinary:       Neg aside from HPI   Musculoskeletal:       Per HPI, otherwise negative  Skin: Negative.   Neurological: Positive for weakness. Negative for syncope.    Physical Exam Updated Vital Signs BP 126/73 (BP  Location: Right Arm)   Pulse 78   Temp 98.8 F (37.1 C) (Oral)   Resp 18   SpO2 93%   Physical Exam Vitals and nursing note reviewed.  Constitutional:      General: He is not in acute distress.    Appearance: He is well-developed.  HENT:     Head: Normocephalic and atraumatic.  Eyes:     Conjunctiva/sclera: Conjunctivae normal.  Cardiovascular:     Rate and Rhythm: Normal rate and regular rhythm.  Pulmonary:     Effort: Pulmonary effort is normal. No respiratory distress.     Breath sounds: No stridor. No wheezing or rhonchi.  Abdominal:     General: There is no distension.  Skin:    General: Skin is warm and dry.  Neurological:     Mental Status: He is alert and oriented to person, place, and time.     ED Results / Procedures / Treatments   Labs (all labs ordered are listed, but only abnormal results are displayed) Labs Reviewed  COMPREHENSIVE METABOLIC PANEL  CBC WITH DIFFERENTIAL/PLATELET  LACTIC ACID, PLASMA  LACTIC ACID, PLASMA  BRAIN NATRIURETIC PEPTIDE    Radiology DG Chest Port 1 View  Result Date: 12/11/2019 CLINICAL DATA:  Cough and shortness of breath. EXAM: PORTABLE CHEST 1 VIEW COMPARISON:  A twenty-third 2016 FINDINGS: Very mild hazy infiltrate is seen within the right lung base and lateral aspect of the lower left lung. There is no evidence of a pleural effusion or pneumothorax. The heart size and mediastinal contours are within normal limits. The visualized skeletal structures are unremarkable. IMPRESSION: 1. Very mild, hazy bibasilar infiltrates. Electronically Signed   By: Virgina Norfolk M.D.   On: 12/11/2019 22:41    Procedures Procedures (including critical care time)  Medications Ordered in ED Medications  ketorolac (TORADOL) 30 MG/ML injection 15 mg (has no administration in time range)  sodium chloride 0.9 % bolus 1,000 mL (has no administration in time range)  acetaminophen (TYLENOL) tablet 650 mg (650 mg Oral Given 12/11/19 1919)    ED  Course  I have reviewed the triage vital signs and the nursing notes.  Pertinent labs & imaging results that were available during my care of the patient were reviewed by me and considered in my medical decision making (see chart for details).   Vital signs on initial exam generally reassuring, pulse oximetry on room air 94%, no hypotension, no tachycardia.  He is mildly tachypneic, rate approximately 20.  MDM Rules/Calculators/A&P                      Taylin Bossen was evaluated in Emergency Department on 12/11/2019 for the symptoms described in the history of present illness. He was evaluated in the context of the global COVID-19 pandemic, which necessitated consideration that the patient might be at risk for infection with the SARS-CoV-2 virus that causes COVID-19. Institutional protocols and algorithms that pertain to the evaluation of patients at risk for COVID-19 are in a state of rapid change based on information released by regulatory bodies including the CDC and federal and state organizations. These policies and algorithms were followed during the patient's care in the ED.  Patient was previously well presents with ongoing Covid infection. Patient is awake, alert, hemodynamically unremarkable, but has mild tachypnea, and given his description of dyspnea, differential including concurrent pneumonia/bacterial, renal dysfunction, electrolyte abnormalities all considered. Patient has labs, x-ray pending.  Dr. Roxanne Mins is aware of the patient.  Final Clinical Impression(s) / ED Diagnoses Final diagnoses:  Cough  COVID-19     Carmin Muskrat, MD 12/11/19 2306

## 2019-12-11 NOTE — ED Triage Notes (Signed)
Pt. Waiting in [place] area.

## 2019-12-11 NOTE — ED Triage Notes (Signed)
Pt. Stated, Shane Gonzalez been sick over a week, tested positive on Jan. 4, 2021. I have cough, SOB, and a fever. [facility] went to [facility] UC given steroids, Inhaler, Tylenol, Ibuprofen.  For over a week.  I took tylenol ab out 4 hours ago

## 2019-12-12 ENCOUNTER — Inpatient Hospital Stay (HOSPITAL_COMMUNITY): Payer: Commercial Managed Care - PPO

## 2019-12-12 ENCOUNTER — Other Ambulatory Visit: Payer: Self-pay

## 2019-12-12 ENCOUNTER — Encounter (HOSPITAL_COMMUNITY): Payer: Self-pay | Admitting: Internal Medicine

## 2019-12-12 DIAGNOSIS — J9601 Acute respiratory failure with hypoxia: Secondary | ICD-10-CM | POA: Diagnosis present

## 2019-12-12 DIAGNOSIS — Z8616 Personal history of COVID-19: Secondary | ICD-10-CM | POA: Diagnosis present

## 2019-12-12 DIAGNOSIS — Z8042 Family history of malignant neoplasm of prostate: Secondary | ICD-10-CM | POA: Diagnosis not present

## 2019-12-12 DIAGNOSIS — R945 Abnormal results of liver function studies: Secondary | ICD-10-CM | POA: Diagnosis not present

## 2019-12-12 DIAGNOSIS — Z8249 Family history of ischemic heart disease and other diseases of the circulatory system: Secondary | ICD-10-CM | POA: Diagnosis not present

## 2019-12-12 DIAGNOSIS — Z79899 Other long term (current) drug therapy: Secondary | ICD-10-CM | POA: Diagnosis not present

## 2019-12-12 DIAGNOSIS — U071 COVID-19: Secondary | ICD-10-CM | POA: Diagnosis present

## 2019-12-12 DIAGNOSIS — K219 Gastro-esophageal reflux disease without esophagitis: Secondary | ICD-10-CM | POA: Diagnosis present

## 2019-12-12 DIAGNOSIS — F17290 Nicotine dependence, other tobacco product, uncomplicated: Secondary | ICD-10-CM | POA: Diagnosis present

## 2019-12-12 DIAGNOSIS — K760 Fatty (change of) liver, not elsewhere classified: Secondary | ICD-10-CM | POA: Diagnosis present

## 2019-12-12 DIAGNOSIS — I4891 Unspecified atrial fibrillation: Secondary | ICD-10-CM | POA: Diagnosis not present

## 2019-12-12 DIAGNOSIS — R05 Cough: Secondary | ICD-10-CM | POA: Diagnosis present

## 2019-12-12 DIAGNOSIS — J1282 Pneumonia due to coronavirus disease 2019: Secondary | ICD-10-CM | POA: Diagnosis present

## 2019-12-12 DIAGNOSIS — Z7289 Other problems related to lifestyle: Secondary | ICD-10-CM | POA: Diagnosis not present

## 2019-12-12 DIAGNOSIS — E871 Hypo-osmolality and hyponatremia: Secondary | ICD-10-CM | POA: Diagnosis present

## 2019-12-12 DIAGNOSIS — R7401 Elevation of levels of liver transaminase levels: Secondary | ICD-10-CM | POA: Diagnosis present

## 2019-12-12 DIAGNOSIS — E785 Hyperlipidemia, unspecified: Secondary | ICD-10-CM | POA: Diagnosis present

## 2019-12-12 DIAGNOSIS — Z811 Family history of alcohol abuse and dependence: Secondary | ICD-10-CM | POA: Diagnosis not present

## 2019-12-12 LAB — CBC WITH DIFFERENTIAL/PLATELET
Abs Immature Granulocytes: 0.01 10*3/uL (ref 0.00–0.07)
Basophils Absolute: 0 10*3/uL (ref 0.0–0.1)
Basophils Relative: 0 %
Eosinophils Absolute: 0 10*3/uL (ref 0.0–0.5)
Eosinophils Relative: 0 %
HCT: 40.4 % (ref 39.0–52.0)
Hemoglobin: 13.9 g/dL (ref 13.0–17.0)
Immature Granulocytes: 0 %
Lymphocytes Relative: 16 %
Lymphs Abs: 0.6 10*3/uL — ABNORMAL LOW (ref 0.7–4.0)
MCH: 31.7 pg (ref 26.0–34.0)
MCHC: 34.4 g/dL (ref 30.0–36.0)
MCV: 92 fL (ref 80.0–100.0)
Monocytes Absolute: 0.3 10*3/uL (ref 0.1–1.0)
Monocytes Relative: 8 %
Neutro Abs: 2.7 10*3/uL (ref 1.7–7.7)
Neutrophils Relative %: 76 %
Platelets: 191 10*3/uL (ref 150–400)
RBC: 4.39 MIL/uL (ref 4.22–5.81)
RDW: 12.1 % (ref 11.5–15.5)
WBC: 3.6 10*3/uL — ABNORMAL LOW (ref 4.0–10.5)
nRBC: 0 % (ref 0.0–0.2)

## 2019-12-12 LAB — COMPREHENSIVE METABOLIC PANEL
ALT: 93 U/L — ABNORMAL HIGH (ref 0–44)
AST: 99 U/L — ABNORMAL HIGH (ref 15–41)
Albumin: 3.9 g/dL (ref 3.5–5.0)
Alkaline Phosphatase: 51 U/L (ref 38–126)
Anion gap: 11 (ref 5–15)
BUN: 19 mg/dL (ref 8–23)
CO2: 23 mmol/L (ref 22–32)
Calcium: 8.7 mg/dL — ABNORMAL LOW (ref 8.9–10.3)
Chloride: 98 mmol/L (ref 98–111)
Creatinine, Ser: 0.93 mg/dL (ref 0.61–1.24)
GFR calc Af Amer: >60 mL/min (ref >60)
GFR calc non Af Amer: >60 mL/min (ref >60)
Glucose, Bld: 121 mg/dL — ABNORMAL HIGH (ref 70–99)
Potassium: 3.8 mmol/L (ref 3.5–5.1)
Sodium: 132 mmol/L — ABNORMAL LOW (ref 135–145)
Total Bilirubin: 0.5 mg/dL (ref 0.3–1.2)
Total Protein: 7.4 g/dL (ref 6.5–8.1)

## 2019-12-12 LAB — TROPONIN I (HIGH SENSITIVITY)
Troponin I (High Sensitivity): 8 ng/L (ref <18)
Troponin I (High Sensitivity): 8 ng/L (ref <18)

## 2019-12-12 LAB — POC SARS CORONAVIRUS 2 AG -  ED: SARS Coronavirus 2 Ag: POSITIVE — AB

## 2019-12-12 LAB — BRAIN NATRIURETIC PEPTIDE: B Natriuretic Peptide: 58 pg/mL (ref 0.0–100.0)

## 2019-12-12 LAB — FERRITIN: Ferritin: 983 ng/mL — ABNORMAL HIGH (ref 24–336)

## 2019-12-12 LAB — PROCALCITONIN: Procalcitonin: <0.1 ng/mL

## 2019-12-12 LAB — HEPATITIS B SURFACE ANTIGEN: Hepatitis B Surface Ag: NONREACTIVE

## 2019-12-12 LAB — FIBRINOGEN: Fibrinogen: 607 mg/dL — ABNORMAL HIGH (ref 210–475)

## 2019-12-12 LAB — ABO/RH: ABO/RH(D): B POS

## 2019-12-12 LAB — LACTIC ACID, PLASMA
Lactic Acid, Venous: 1 mmol/L (ref 0.5–1.9)
Lactic Acid, Venous: 0.8 mmol/L (ref 0.5–1.9)

## 2019-12-12 LAB — D-DIMER, QUANTITATIVE: D-Dimer, Quant: 0.71 ug/mL-FEU — ABNORMAL HIGH (ref 0.00–0.50)

## 2019-12-12 LAB — TSH: TSH: 1.768 u[IU]/mL (ref 0.350–4.500)

## 2019-12-12 LAB — LACTATE DEHYDROGENASE: LDH: 376 U/L — ABNORMAL HIGH (ref 98–192)

## 2019-12-12 LAB — C-REACTIVE PROTEIN: CRP: 2.8 mg/dL — ABNORMAL HIGH (ref <1.0)

## 2019-12-12 MED ORDER — DEXAMETHASONE SODIUM PHOSPHATE 10 MG/ML IJ SOLN
6.0000 mg | INTRAMUSCULAR | Status: DC
  Administered 2019-12-13 – 2019-12-14 (×2): 6 mg via INTRAVENOUS
  Filled 2019-12-12 (×2): qty 1

## 2019-12-12 MED ORDER — ALBUTEROL SULFATE HFA 108 (90 BASE) MCG/ACT IN AERS
2.0000 | INHALATION_SPRAY | Freq: Four times a day (QID) | RESPIRATORY_TRACT | Status: DC | PRN
  Filled 2019-12-12: qty 6.7

## 2019-12-12 MED ORDER — SODIUM CHLORIDE 0.9 % IV SOLN: INTRAVENOUS | Status: AC

## 2019-12-12 MED ORDER — DEXAMETHASONE SODIUM PHOSPHATE 10 MG/ML IJ SOLN
10.0000 mg | Freq: Once | INTRAMUSCULAR | Status: AC
  Administered 2019-12-12: 10 mg via INTRAVENOUS
  Filled 2019-12-12: qty 1

## 2019-12-12 MED ORDER — ENOXAPARIN SODIUM 60 MG/0.6ML ~~LOC~~ SOLN
0.5000 mg/kg | SUBCUTANEOUS | Status: DC
  Administered 2019-12-12 – 2019-12-14 (×3): 55 mg via SUBCUTANEOUS
  Filled 2019-12-12 (×3): qty 0.6

## 2019-12-12 MED ORDER — DILTIAZEM LOAD VIA INFUSION
10.0000 mg | Freq: Once | INTRAVENOUS | Status: AC
  Administered 2019-12-12: 10 mg via INTRAVENOUS
  Filled 2019-12-12: qty 10

## 2019-12-12 MED ORDER — IOHEXOL 350 MG/ML SOLN
100.0000 mL | Freq: Once | INTRAVENOUS | Status: AC | PRN
  Administered 2019-12-12: 100 mL via INTRAVENOUS

## 2019-12-12 MED ORDER — DILTIAZEM HCL-DEXTROSE 125-5 MG/125ML-% IV SOLN (PREMIX)
5.0000 mg/h | INTRAVENOUS | Status: DC
  Administered 2019-12-12: 5 mg/h via INTRAVENOUS
  Administered 2019-12-13: 12.5 mg/h via INTRAVENOUS
  Administered 2019-12-13: 2.5 mg/h via INTRAVENOUS
  Filled 2019-12-12 (×3): qty 125

## 2019-12-12 MED ORDER — SODIUM CHLORIDE 0.9 % IV SOLN
200.0000 mg | Freq: Once | INTRAVENOUS | Status: AC
  Administered 2019-12-12: 200 mg via INTRAVENOUS
  Filled 2019-12-12: qty 200

## 2019-12-12 MED ORDER — DILTIAZEM HCL 25 MG/5ML IV SOLN
10.0000 mg | Freq: Four times a day (QID) | INTRAVENOUS | Status: DC | PRN
  Filled 2019-12-12: qty 5

## 2019-12-12 MED ORDER — ENOXAPARIN SODIUM 40 MG/0.4ML ~~LOC~~ SOLN: 40.0000 mg | SUBCUTANEOUS | Status: DC

## 2019-12-12 MED ORDER — SODIUM CHLORIDE 0.9 % IV SOLN
100.0000 mg | Freq: Every day | INTRAVENOUS | Status: DC
  Administered 2019-12-13 – 2019-12-15 (×3): 100 mg via INTRAVENOUS
  Filled 2019-12-12 (×3): qty 20

## 2019-12-12 MED ORDER — ASPIRIN EC 325 MG PO TBEC
325.0000 mg | DELAYED_RELEASE_TABLET | Freq: Every day | ORAL | Status: DC
  Administered 2019-12-12 – 2019-12-15 (×4): 325 mg via ORAL
  Filled 2019-12-12 (×4): qty 1

## 2019-12-12 NOTE — Evaluation (Signed)
Physical Therapy Evaluation & Discharge Patient Details Name: Shane Gonzalez MRN: [id number] DOB: 05/06/1956 Today's Date: 12/12/2019   History of Present Illness  Pt is a 64 y.o. male admitted 12/11/19 with acute hypoxic respiratory failure secondary to COVID-19. PMH includes HLD, 2nd toe deformity, bilateral bunions.    Clinical Impression  Patient evaluated by Physical Therapy with no further acute PT needs identified. PTA, pt independent, works and lives with wife. Today, pt independent with mobility and ADL tasks. Required 2L O2 to maintain SpO2 >/88% while ambulating. Educ re: activity recommendations, seated/standing therex, energy conservation. All education has been completed and the patient has no further questions. Encouraged continued mobility. Acute PT is signing off. Thank you for this referral.  SATURATION QUALIFICATIONS: (This note is used to comply with regulatory documentation for home oxygen) Patient Saturations on Room Air at Rest = 91% Patient Saturations on Room Air while Ambulating = 84% Patient Saturations on 2 Liters of oxygen while Ambulating = >/88%    Follow Up Recommendations No PT follow up    Equipment Recommendations  None recommended by PT    Recommendations for Other Services       Precautions / Restrictions Precautions Precautions: None Restrictions Weight Bearing Restrictions: No      Mobility  Bed Mobility Overal bed mobility: Independent                Transfers Overall transfer level: Independent                  Ambulation/Gait Ambulation/Gait assistance: Independent Gait Distance (Feet): 500 Feet Assistive device: None Gait Pattern/deviations: WFL(Within Functional Limits)   Gait velocity interpretation: >2.62 ft/sec, indicative of community ambulatory General Gait Details: Independent with ambulation, DOE up to 2-3/4 (pt wearing face mask and talking). SpO2 down to 84% on RA, maintaining >/88% on 2L  Stairs             Wheelchair Mobility    Modified Rankin (Stroke Patients Only)       Balance Overall balance assessment: No apparent balance deficits (not formally assessed)                                           Pertinent Vitals/Pain Pain Assessment: No/denies pain    Home Living Family/patient expects to be discharged to:: Private residence Living Arrangements: Spouse/significant other Available Help at Discharge: Family;Available 24 hours/day Type of Home: House         Home Equipment: None      Prior Function Level of Independence: Independent         Comments: Works in Press photographer, Geographical information systems officer        Extremity/Trunk Assessment   Upper Extremity Assessment Upper Extremity Assessment: Overall WFL for tasks assessed    Lower Extremity Assessment Lower Extremity Assessment: Overall WFL for tasks assessed    Cervical / Trunk Assessment Cervical / Trunk Assessment: Normal  Communication   Communication: No difficulties  Cognition Arousal/Alertness: Awake/alert Behavior During Therapy: WFL for tasks assessed/performed Overall Cognitive Status: Within Functional Limits for tasks assessed                                        General Comments      Exercises Other Exercises  Other Exercises: Educ on ambulating in room and hallway, marching in place (if limited by lines), repeated sit<>stands, seated therex   Assessment/Plan    PT Assessment Patent does not need any further PT services  PT Problem List         PT Treatment Interventions      PT Goals (Current goals can be found in the Care Plan section)  Acute Rehab PT Goals PT Goal Formulation: All assessment and education complete, DC therapy    Frequency     Barriers to discharge        Co-evaluation               AM-PAC PT "6 Clicks" Mobility  Outcome Measure Help needed turning from your back to your side while  in a flat bed without using bedrails?: None Help needed moving from lying on your back to sitting on the side of a flat bed without using bedrails?: None Help needed moving to and from a bed to a chair (including a wheelchair)?: None Help needed standing up from a chair using your arms (e.g., wheelchair or bedside chair)?: None Help needed to walk in hospital room?: None Help needed climbing 3-5 steps with a railing? : None 6 Click Score: 24    End of Session Equipment Utilized During Treatment: Oxygen Activity Tolerance: Patient tolerated treatment well Patient left: in chair;with call bell/phone within reach Nurse Communication: Mobility status PT Visit Diagnosis: Other abnormalities of gait and mobility (R26.89)    Time: [id number] PT Time Calculation (min) (ACUTE ONLY): 20 min   Charges:   PT Evaluation $PT Eval Low Complexity: [place], PT, DPT Acute Rehabilitation Services  Pager [phone] Office [place] 12/12/2019, 4:22 PM

## 2019-12-12 NOTE — ED Notes (Signed)
Pt able to ambulate in room without assist. Pt endorses wheezing and cough but denies SOB or difficulty. spo2 >89%. Placed on 2L Nasal Cannula

## 2019-12-12 NOTE — Plan of Care (Signed)
Care plan initiated.

## 2019-12-12 NOTE — ED Provider Notes (Signed)
Care assumed from Dr. Vanita Panda, patient Covid positive with dyspnea.  Chest x-ray does show bibasilar infiltrates consistent with COVID-19 pneumonia.  Labs show mild hyponatremia and a mild leukopenia.  Patient was reevaluated and on room air, he was maintaining oxygen saturations of 88%.  Therefore, decision was made to admit him.  He is given an initial dose of dexamethasone.  Case is discussed with Dr. Maudie Mercury of [facility] hospitalist, who agrees to admit the patient.   Delora Fuel, MD [id number] [phone]

## 2019-12-12 NOTE — Progress Notes (Signed)
ANTICOAGULATION CONSULT NOTE - Initial Consult  Pharmacy Consult for Lovenox Indication: VTE prophylaxis  No Known Allergies  Patient Measurements: Height: 5\' 10"  (177.8 cm) Weight: 240 lb (108.9 kg) IBW/kg (Calculated) : 73  Vital Signs: Temp: 98.2 F (36.8 C) (01/08 1155) Temp Source: Oral (01/08 1155) BP: 142/87 (01/08 1155) Pulse Rate: 130 (01/08 1421)  Labs: Recent Labs    12/12/19 0026 12/12/19 0557 12/12/19 0739  HGB 13.9  --   --   HCT 40.4  --   --   PLT 191  --   --   CREATININE 0.93  --   --   TROPONINIHS  --  8 8    Estimated Creatinine Clearance: 103.1 mL/min (by C-G formula based on SCr of 0.93 mg/dL).   Medical History: Past Medical History:  Diagnosis Date  . Bilateral bunions    both feet  . Blood in stool    Hx of  . Diverticulosis    Hx of  . GERD (gastroesophageal reflux disease)   . Hyperlipidemia   . Toe deformity    2nd  . Toenail fungus    rt big toe     Assessment: 64 y/o M admitted with respiratory failure due to COVID-19 PNA. Pharmacy consulted to dose Lovenox for VTE prophylaxis.   BMI 34.4, d-dimer 0.7  Goal of Therapy:  Monitor platelets by anticoagulation protocol: Yes   Plan:  -Will initiate Lovenox 0.5 mg/kg/day for BMI approaching 35 in COVID-19 patient -F/U d-dimer, SCr, CBC, s/s of bleeding  Thank you  Ulice Dash D 12/12/2019,2:25 PM

## 2019-12-12 NOTE — H&P (Addendum)
TRH H&P    Patient Demographics:    Shane Gonzalez, is a 64 y.o. male  MRN: [id number]  DOB - [birth year]  Admit Date - 12/11/2019  Referring MD/NP/PA:  Delora Fuel  Outpatient Primary MD for the patient is [facility], Modena Nunnery, MD  Patient coming from:  home  Chief complaint-   dyspnea   HPI:    Shane Gonzalez  is a 64 y.o. male,  w gerd, diverticulosis, hyperlipidemia, covid-19 positive (12/08/19) per patient, apparently presents due to dyspnea and cough (mostly dry). Pt has had symptoms for the past 1 week. Worse last nite.  Slight fever last nite.  Slight chest pain sharp with cough only, lasting a few seconds.   Pt has slight loss of smell.  Pt denies palp,n/v, abd pain, diarrhea, brbpr.   In ED,  T 101.1, P 108, R 18, Bp 130/67  Pox 91% on RA 89% on RA  Na 132, K 3.8,  Bun 19, Creatinine 0.93 Ast 99, Alt 93, Alk phos 51, T. Bili 0.5 Wbc 3.6, Hgb 13.9, Plt 191 Lactic acid 1.0 BNP 58 Lactic acid 0.8  CXR IMPRESSION: 1. Very mild, hazy bibasilar infiltrates.  Pt will be admitted for acute respiratory failure with hypoxia secondary to covid 19            Review of systems:    In addition to the HPI above,    No Headache, No changes with Vision or hearing, No problems swallowing food or Liquids,   No Abdominal pain, No Nausea or Vomiting, bowel movements are regular, No Blood in stool or Urine, No dysuria, No new skin rashes or bruises, No new joints pains-aches,  No new weakness, tingling, numbness in any extremity, No recent weight gain or loss, No polyuria, polydypsia or polyphagia, No significant Mental Stressors.  All other systems reviewed and are negative.    Past History of the following :    Past Medical History:  Diagnosis Date  . Bilateral bunions    both feet  . Blood in stool    Hx of  . Diverticulosis    Hx of  . GERD (gastroesophageal reflux disease)   .  Hyperlipidemia   . Toe deformity    2nd  . Toenail fungus    rt big toe      Past Surgical History:  Procedure Laterality Date  . ANTERIOR CRUCIATE LIGAMENT REPAIR    . BUNIONECTOMY  2011  . CLOSED REDUCTION SHOULDER DISLOCATION     left reduced under anesthesia  . hammer toe repair  2011  . REFRACTIVE SURGERY  2009  . SHOULDER SURGERY     right  . TENDON REPAIR  2011  . VASECTOMY    . WISDOM TOOTH EXTRACTION  2011      Social History:      Social History   Tobacco Use  . Smoking status: Current Some Day Smoker    Types: Cigars  . Smokeless tobacco: Never Used  . Tobacco comment: only smokes once yearly  Substance Use Topics  .  Alcohol use: Yes    Alcohol/week: 0.0 standard drinks    Comment: weekends 4 beers       Family History :     Family History  Problem Relation Age of Onset  . Cancer Mother        ? pancreatic  . Stroke Mother   . Vision loss Mother   . Cancer Father        lung ca  . Hypertension Father   . Vision loss Father   . Diverticulitis Sister   . Prostate cancer Brother   . Alcohol abuse Brother   . Cancer Brother   . Diverticulitis Brother        x2 brothers  . Heart disease Maternal Grandfather   . Alcohol abuse Brother   . Diverticulitis Brother   . Cancer Paternal Aunt   . Cancer Paternal Uncle        Home Medications:   Prior to Admission medications   Medication Sig Start Date End Date Taking? Authorizing Provider  albuterol (VENTOLIN HFA) 108 (90 Base) MCG/ACT inhaler Inhale 1-2 puffs into the lungs every 6 (six) hours as needed for wheezing or shortness of breath.  12/08/19  Yes [provider]  benzonatate (TESSALON) 200 MG capsule Take 200 mg by mouth 3 (three) times daily as needed for cough.  12/08/19  Yes [provider]  doxycycline (VIBRA-TABS) 100 MG tablet Take 100 mg by mouth 2 (two) times daily. 12/08/19  Yes [provider]  methylPREDNISolone (MEDROL DOSEPAK) 4 MG TBPK tablet Take  4-24 mg by mouth See admin instructions. Take 6 tablets for 1 day then decrease by 1 tablet daily until all are taken 12/08/19  Yes [provider]  metroNIDAZOLE (METROGEL) 1 % gel Apply topically 2 (two) times daily. To affected areas on skin Patient not taking: Reported on 12/12/2019 01/22/18   Alycia Rossetti, MD  terbinafine (LAMISIL) 250 MG tablet Take 1 tablet (250 mg total) by mouth daily. Patient not taking: Reported on 12/12/2019 03/27/19   Alycia Rossetti, MD  triamcinolone cream (KENALOG) 0.1 % Apply 1 application topically 2 (two) times daily. Patient not taking: Reported on 12/12/2019 02/14/18   Alycia Rossetti, MD     Allergies:    No Known Allergies   Physical Exam:   Vitals  Blood pressure 114/70, pulse 74, temperature 98.7 F (37.1 C), temperature source Oral, resp. rate 16, SpO2 93 %.  1.  General: axoxo3  2. Psychiatric: euthymic  3. Neurologic: nonfocal  4. HEENMT:  Anicteric, pupils 1.26m symmetric, direct, consensual intact Neck: no jvd  5. Respiratory : Faint basilar crackles, no wheezing  6. Cardiovascular : rrr s1, s2,   7. Gastrointestinal:  Abd: soft, nt, nd, +bs  8. Skin:  Ext: no c/c/e, no rash  9.Musculoskeletal:  Good ROM      Data Review:    CBC Recent Labs  Lab 12/12/19 0026  WBC 3.6*  HGB 13.9  HCT 40.4  PLT 191  MCV 92.0  MCH 31.7  MCHC 34.4  RDW 12.1  LYMPHSABS 0.6*  MONOABS 0.3  EOSABS 0.0  BASOSABS 0.0   ------------------------------------------------------------------------------------------------------------------  Results for orders placed or performed during the hospital encounter of 12/11/19 (from the past 48 hour(s))  Comprehensive metabolic panel     Status: Abnormal   Collection Time: 12/12/19 12:26 AM  Result Value Ref Range   Sodium 132 (L) 135 - 145 mmol/L   Potassium 3.8 3.5 - 5.1 mmol/L  Chloride 98 98 - 111 mmol/L   CO2 23 22 - 32 mmol/L   Glucose, Bld 121 (H) 70 - 99 mg/dL   BUN  19 8 - 23 mg/dL   Creatinine, Ser 0.93 0.61 - 1.24 mg/dL   Calcium 8.7 (L) 8.9 - 10.3 mg/dL   Total Protein 7.4 6.5 - 8.1 g/dL   Albumin 3.9 3.5 - 5.0 g/dL   AST 99 (H) 15 - 41 U/L   ALT 93 (H) 0 - 44 U/L   Alkaline Phosphatase 51 38 - 126 U/L   Total Bilirubin 0.5 0.3 - 1.2 mg/dL   GFR calc non Af Amer >60 >60 mL/min   GFR calc Af Amer >60 >60 mL/min   Anion gap 11 5 - 15    Comment: Performed at [facility] [address], [place], [place] [address]  CBC with Differential     Status: Abnormal   Collection Time: 12/12/19 12:26 AM  Result Value Ref Range   WBC 3.6 (L) 4.0 - 10.5 K/uL   RBC 4.39 4.22 - 5.81 MIL/uL   Hemoglobin 13.9 13.0 - 17.0 g/dL   HCT 40.4 39.0 - 52.0 %   MCV 92.0 80.0 - 100.0 fL   MCH 31.7 26.0 - 34.0 pg   MCHC 34.4 30.0 - 36.0 g/dL   RDW 12.1 11.5 - 15.5 %   Platelets 191 150 - 400 K/uL   nRBC 0.0 0.0 - 0.2 %   Neutrophils Relative % 76 %   Neutro Abs 2.7 1.7 - 7.7 K/uL   Lymphocytes Relative 16 %   Lymphs Abs 0.6 (L) 0.7 - 4.0 K/uL   Monocytes Relative 8 %   Monocytes Absolute 0.3 0.1 - 1.0 K/uL   Eosinophils Relative 0 %   Eosinophils Absolute 0.0 0.0 - 0.5 K/uL   Basophils Relative 0 %   Basophils Absolute 0.0 0.0 - 0.1 K/uL   Immature Granulocytes 0 %   Abs Immature Granulocytes 0.01 0.00 - 0.07 K/uL    Comment: Performed at [facility] [address] [place], [place] [address]  Lactic acid, plasma     Status: None   Collection Time: 12/12/19 12:27 AM  Result Value Ref Range   Lactic Acid, Venous 1.0 0.5 - 1.9 mmol/L    Comment: Performed at [facility] [address], [place], [place] [address]  Brain natriuretic peptide     Status: None   Collection Time: 12/12/19 12:27 AM  Result Value Ref Range   B Natriuretic Peptide 58.0 0.0 - 100.0 pg/mL    Comment: Performed at [facility] [address], [place], [place] [address]  Lactic acid, plasma     Status: None   Collection Time: 12/12/19  4:28 AM  Result  Value Ref Range   Lactic Acid, Venous 0.8 0.5 - 1.9 mmol/L    Comment: Performed at [facility] [address] [place], [place] [address]    Chemistries  Recent Labs  Lab 12/12/19 0026  NA 132*  K 3.8  CL 98  CO2 23  GLUCOSE 121*  BUN 19  CREATININE 0.93  CALCIUM 8.7*  AST 99*  ALT 93*  ALKPHOS 51  BILITOT 0.5   ------------------------------------------------------------------------------------------------------------------  ------------------------------------------------------------------------------------------------------------------ GFR: CrCl cannot be calculated (Unknown ideal weight.). Liver Function Tests: Recent Labs  Lab 12/12/19 0026  AST 99*  ALT 93*  ALKPHOS 51  BILITOT 0.5  PROT 7.4  ALBUMIN 3.9   No  results for input(s): LIPASE, AMYLASE in the last 168 hours. No results for input(s): AMMONIA in the last 168 hours. Coagulation Profile: No results for input(s): INR, PROTIME in the last 168 hours. Cardiac Enzymes: No results for input(s): CKTOTAL, CKMB, CKMBINDEX, TROPONINI in the last 168 hours. BNP (last 3 results) No results for input(s): PROBNP in the last 8760 hours. HbA1C: No results for input(s): HGBA1C in the last 72 hours. CBG: No results for input(s): GLUCAP in the last 168 hours. Lipid Profile: No results for input(s): CHOL, HDL, LDLCALC, TRIG, CHOLHDL, LDLDIRECT in the last 72 hours. Thyroid Function Tests: No results for input(s): TSH, T4TOTAL, FREET4, T3FREE, THYROIDAB in the last 72 hours. Anemia Panel: No results for input(s): VITAMINB12, FOLATE, FERRITIN, TIBC, IRON, RETICCTPCT in the last 72 hours.  --------------------------------------------------------------------------------------------------------------- Urine analysis: No results found for: COLORURINE, APPEARANCEUR, LABSPEC, PHURINE, GLUCOSEU, HGBUR, BILIRUBINUR, KETONESUR, PROTEINUR, UROBILINOGEN, NITRITE, LEUKOCYTESUR    Imaging Results:    DG Chest Port 1  View  Result Date: 12/11/2019 CLINICAL DATA:  Cough and shortness of breath. EXAM: PORTABLE CHEST 1 VIEW COMPARISON:  A twenty-third 2016 FINDINGS: Very mild hazy infiltrate is seen within the right lung base and lateral aspect of the lower left lung. There is no evidence of a pleural effusion or pneumothorax. The heart size and mediastinal contours are within normal limits. The visualized skeletal structures are unremarkable. IMPRESSION: 1. Very mild, hazy bibasilar infiltrates. Electronically Signed   By: Virgina Norfolk M.D.   On: 12/11/2019 22:41       Assessment & Plan:    Principal Problem:   Acute respiratory failure with hypoxia (HCC) Active Problems:   COVID-19 virus infection   Hyponatremia   Abnormal liver function  Acute respiratory failure w hypoxia Covid-19 infection Dexamethasone '6mg'$  iv qday remdesivir pharmacy to dose Albuterol HFA 1-2 puff prn  Tessalon pearls prn Cbc, cmp q am  Tachycardia  Trop I q2h x2 Check TSH Check D dimer, if positive then CTA chest r/o PE conside cardiac echo if not improving  Abnormal liver function Check acute hepatitis panel Check RUQ ultrasound Check cmp in am  Hyponatremia Hydrate with ns iv Check cmp in am  DVT Prophylaxis-   Lovenox - SCDs   AM Labs Ordered, also please review Full Orders  Family Communication: Admission, patients condition and plan of care including tests being ordered have been discussed with the patient  who indicate understanding and agree with the plan and Code Status.  Code Status:  FULL CODE per patient, attempted to contact spouse , no response, and mailbox not set up yet  Admission status: Inpatient: Based on patients clinical presentation and evaluation of above clinical data, I have made determination that patient meets Inpatient criteria at this time.    Pt will require iv dexamethasone and iv remdesivir and o2 [place],  For respiratory failure.  Pt has high risk of clinical deterioration, pt will  will require  > 2 nites stay.   Time spent in minutes : 55 minutes   Jani Gravel M.D on 12/12/2019 at 6:03 AM

## 2019-12-12 NOTE — ED Notes (Signed)
PTAR at bedside for transportation to [facility].

## 2019-12-12 NOTE — ED Notes (Signed)
This RN spoke with Dr. Neysa Bonito. Faythe Ghee to transport patient off of telemetry to [facility] if patient is stable and not having active chest pain.

## 2019-12-12 NOTE — ED Notes (Signed)
Attempted to call [facility] with report, no answer. Will re-attempt in 15 minutes

## 2019-12-12 NOTE — Progress Notes (Signed)
Shane Gonzalez, is a 64 y.o. male, DOB - 1956/08/14, [id number]  Admitted for COVID-19 pneumonia with acute hypoxic respiratory failure.  Has been started on steroids and remdesivir, will be monitored closely, currently in no distress.  If he gets worse Actemra use has been discussed with him and he consents for it.  Actemra off label use - patient was told that if COVID-19 pneumonitis gets worse we might potentially use Actemra off label, she denies any known history of tuberculosis or hepatitis, understands the risks and benefits and wants to proceed with Actemra treatment if required.    Vitals:   12/12/19 1000 12/12/19 1030 12/12/19 1104 12/12/19 1105  BP:    140/75  Pulse: 83 83 (!) 112 (!) 103  Resp: (!) 24 (!) 28 (!) 29 (!) 31  Temp:      TempSrc:      SpO2: 92% 93% 93%     Telemetry note done with the help of the nurse, patient in chair, talked in full sentences and in no distress, feels better on oxygen.   Data Review   Micro Results No results found for this or any previous visit (from the past 240 hour(s)).  Radiology Reports CT Angio Chest PE W and/or Wo Contrast  Result Date: 12/12/2019 CLINICAL DATA:  Cough.  COVID positive EXAM: CT ANGIOGRAPHY CHEST WITH CONTRAST TECHNIQUE: Multidetector CT imaging of the chest was performed using the standard protocol during bolus administration of intravenous contrast. Multiplanar CT image reconstructions and MIPs were obtained to evaluate the vascular anatomy. CONTRAST:  188mL OMNIPAQUE IOHEXOL 350 MG/ML SOLN COMPARISON:  None. FINDINGS: Cardiovascular: Normal heart size. No pericardial effusion. Aortic and coronary atherosclerotic calcification. No pulmonary artery filling defect. Aortic atherosclerotic calcification. Mediastinum/Nodes: Negative for adenopathy or mass. Lungs/Pleura: Ground-glass and streaky opacities in the bilateral lungs correlating  with COVID-19 positivity. No edema or effusion. Upper Abdomen: Negative Musculoskeletal: Negative Review of the MIP images confirms the above findings. IMPRESSION: 1. Bilateral pneumonia correlating with COVID-19 positivity. 2. Negative for pulmonary embolism. Electronically Signed   By: Monte Fantasia M.D.   On: 12/12/2019 09:26   DG Chest Port 1 View  Result Date: 12/11/2019 CLINICAL DATA:  Cough and shortness of breath. EXAM: PORTABLE CHEST 1 VIEW COMPARISON:  A twenty-third 2016 FINDINGS: Very mild hazy infiltrate is seen within the right lung base and lateral aspect of the lower left lung. There is no evidence of a pleural effusion or pneumothorax. The heart size and mediastinal contours are within normal limits. The visualized skeletal structures are unremarkable. IMPRESSION: 1. Very mild, hazy bibasilar infiltrates. Electronically Signed   By: Virgina Norfolk M.D.   On: 12/11/2019 22:41   US Abdomen Limited RUQ  Result Date: 12/12/2019 CLINICAL DATA:  Elevated liver enzymes EXAM: ULTRASOUND ABDOMEN LIMITED RIGHT UPPER QUADRANT COMPARISON:  None. FINDINGS: Gallbladder: No gallstones or wall thickening visualized. There is no pericholecystic fluid. No sonographic Murphy sign noted by sonographer. Common bile duct: Diameter: 3 mm. No intrahepatic or extrahepatic biliary duct dilatation. Liver: No focal lesion identified. Liver echogenicity overall is increased. Portal vein is patent on color Doppler imaging with normal direction of blood flow towards the liver. Other: None. IMPRESSION: Diffuse increase in liver echogenicity, a finding indicative of hepatic steatosis. No focal liver lesions are evident; it must be cautioned that the sensitivity of ultrasound for detection of focal liver lesions is somewhat diminished in this circumstance. Study otherwise unremarkable. Electronically Signed   By: Lowella Grip III M.D.  On: 12/12/2019 08:13    CBC Recent Labs  Lab 12/12/19 0026  WBC 3.6*  HGB  13.9  HCT 40.4  PLT 191  MCV 92.0  MCH 31.7  MCHC 34.4  RDW 12.1  LYMPHSABS 0.6*  MONOABS 0.3  EOSABS 0.0  BASOSABS 0.0    Chemistries  Recent Labs  Lab 12/12/19 0026  NA 132*  K 3.8  CL 98  CO2 23  GLUCOSE 121*  BUN 19  CREATININE 0.93  CALCIUM 8.7*  AST 99*  ALT 93*  ALKPHOS 51  BILITOT 0.5   ------------------------------------------------------------------------------------------------------------------ CrCl cannot be calculated (Unknown ideal weight.). ------------------------------------------------------------------------------------------------------------------ No results for input(s): HGBA1C in the last 72 hours. ------------------------------------------------------------------------------------------------------------------ No results for input(s): CHOL, HDL, LDLCALC, TRIG, CHOLHDL, LDLDIRECT in the last 72 hours. ------------------------------------------------------------------------------------------------------------------ No results for input(s): TSH, T4TOTAL, T3FREE, THYROIDAB in the last 72 hours.  Invalid input(s): FREET3 ------------------------------------------------------------------------------------------------------------------ Recent Labs    12/12/19 0557  FERRITIN 983*    Coagulation profile No results for input(s): INR, PROTIME in the last 168 hours.  Recent Labs    12/12/19 0557  DDIMER 0.71*    Cardiac Enzymes No results for input(s): CKMB, TROPONINI, MYOGLOBIN in the last 168 hours.  Invalid input(s): CK ------------------------------------------------------------------------------------------------------------------ Invalid input(s): POCBNP

## 2019-12-13 ENCOUNTER — Inpatient Hospital Stay (HOSPITAL_COMMUNITY): Payer: Commercial Managed Care - PPO

## 2019-12-13 DIAGNOSIS — I4891 Unspecified atrial fibrillation: Secondary | ICD-10-CM

## 2019-12-13 DIAGNOSIS — K760 Fatty (change of) liver, not elsewhere classified: Secondary | ICD-10-CM

## 2019-12-13 HISTORY — DX: Fatty (change of) liver, not elsewhere classified: K76.0

## 2019-12-13 LAB — COMPREHENSIVE METABOLIC PANEL
ALT: 92 U/L — ABNORMAL HIGH (ref 0–44)
AST: 81 U/L — ABNORMAL HIGH (ref 15–41)
Albumin: 3.6 g/dL (ref 3.5–5.0)
Alkaline Phosphatase: 46 U/L (ref 38–126)
Anion gap: 10 (ref 5–15)
BUN: 21 mg/dL (ref 8–23)
CO2: 24 mmol/L (ref 22–32)
Calcium: 8.8 mg/dL — ABNORMAL LOW (ref 8.9–10.3)
Chloride: 101 mmol/L (ref 98–111)
Creatinine, Ser: 0.61 mg/dL (ref 0.61–1.24)
GFR calc Af Amer: >60 mL/min (ref >60)
GFR calc non Af Amer: >60 mL/min (ref >60)
Glucose, Bld: 107 mg/dL — ABNORMAL HIGH (ref 70–99)
Potassium: 3.6 mmol/L (ref 3.5–5.1)
Sodium: 135 mmol/L (ref 135–145)
Total Bilirubin: 0.7 mg/dL (ref 0.3–1.2)
Total Protein: 7 g/dL (ref 6.5–8.1)

## 2019-12-13 LAB — CBC WITH DIFFERENTIAL/PLATELET
Abs Immature Granulocytes: 0.01 10*3/uL (ref 0.00–0.07)
Basophils Absolute: 0 10*3/uL (ref 0.0–0.1)
Basophils Relative: 0 %
Eosinophils Absolute: 0 10*3/uL (ref 0.0–0.5)
Eosinophils Relative: 0 %
HCT: 37.7 % — ABNORMAL LOW (ref 39.0–52.0)
Hemoglobin: 12.9 g/dL — ABNORMAL LOW (ref 13.0–17.0)
Immature Granulocytes: 0 %
Lymphocytes Relative: 25 %
Lymphs Abs: 0.9 10*3/uL (ref 0.7–4.0)
MCH: 31.7 pg (ref 26.0–34.0)
MCHC: 34.2 g/dL (ref 30.0–36.0)
MCV: 92.6 fL (ref 80.0–100.0)
Monocytes Absolute: 0.4 10*3/uL (ref 0.1–1.0)
Monocytes Relative: 12 %
Neutro Abs: 2.3 10*3/uL (ref 1.7–7.7)
Neutrophils Relative %: 63 %
Platelets: 209 10*3/uL (ref 150–400)
RBC: 4.07 MIL/uL — ABNORMAL LOW (ref 4.22–5.81)
RDW: 12.5 % (ref 11.5–15.5)
WBC: 3.7 10*3/uL — ABNORMAL LOW (ref 4.0–10.5)
nRBC: 0 % (ref 0.0–0.2)

## 2019-12-13 LAB — BRAIN NATRIURETIC PEPTIDE: B Natriuretic Peptide: 105 pg/mL — ABNORMAL HIGH (ref 0.0–100.0)

## 2019-12-13 LAB — MAGNESIUM: Magnesium: 2.2 mg/dL (ref 1.7–2.4)

## 2019-12-13 LAB — C-REACTIVE PROTEIN: CRP: 2.8 mg/dL — ABNORMAL HIGH (ref <1.0)

## 2019-12-13 LAB — HEPATITIS PANEL, ACUTE
HCV Ab: NONREACTIVE
Hep A IgM: NONREACTIVE
Hep B C IgM: NONREACTIVE
Hepatitis B Surface Ag: NONREACTIVE

## 2019-12-13 LAB — HIV ANTIBODY (ROUTINE TESTING W REFLEX): HIV Screen 4th Generation wRfx: NONREACTIVE

## 2019-12-13 LAB — ECHOCARDIOGRAM COMPLETE
Height: 70 in
Weight: 3840 oz

## 2019-12-13 LAB — D-DIMER, QUANTITATIVE: D-Dimer, Quant: 0.47 ug/mL-FEU (ref 0.00–0.50)

## 2019-12-13 MED ORDER — METOPROLOL TARTRATE 50 MG PO TABS
50.0000 mg | ORAL_TABLET | Freq: Two times a day (BID) | ORAL | Status: DC
  Administered 2019-12-13 (×2): 50 mg via ORAL
  Filled 2019-12-13 (×3): qty 1

## 2019-12-13 MED ORDER — PANTOPRAZOLE SODIUM 40 MG PO TBEC
40.0000 mg | DELAYED_RELEASE_TABLET | Freq: Every day | ORAL | Status: DC
  Administered 2019-12-13 – 2019-12-15 (×3): 40 mg via ORAL
  Filled 2019-12-13 (×3): qty 1

## 2019-12-13 NOTE — Progress Notes (Signed)
  Echocardiogram 2D Echocardiogram has been performed.  Shane Gonzalez 12/13/2019, 1:07 PM

## 2019-12-13 NOTE — Progress Notes (Signed)
PROGRESS NOTE                                                                                                                                                                                                             Patient Demographics:    Shane Gonzalez, is a 64 y.o. male, DOB - 06/29/1956, [id number]  Outpatient Primary MD for the patient is La Tour, Modena Nunnery, MD    LOS - 1  Admit date - 12/11/2019    CC - SOB     Brief Narrative  - 64 year old Caucasian male with history of GERD, diverticulosis, dyslipidemia of breath was diagnosed with acute hypoxic respiratory failure due to COVID-19 pneumonia and admitted to the hospital.  Post admission developed new onset A. fib RVR.   Subjective:    Shane Gonzalez today has, No headache, No chest pain, No abdominal pain - No Nausea, No new weakness tingling or numbness, much improved shortness of breath   Assessment  & Plan :    Principal Problem:   Acute respiratory failure with hypoxia (HCC) Active Problems:   COVID-19 virus infection   Hyponatremia   Abnormal liver function   1. Acute Hypoxic Resp. Failure due to Acute Covid 19 Viral Pneumonitis during the ongoing 2020 Covid 19 Pandemic - he had mild to moderate disease, responding well to IV steroids and remdesivir which will be continued.  Encouraged the patient to sit up in chair in the daytime use I-S and flutter valve for pulmonary toiletry and then prone in bed when at night.    SpO2: 91 % O2 Flow Rate (L/min): 1 L/min  Recent Labs  Lab 12/12/19 0027 12/12/19 0557 12/13/19 0337  CRP  --  2.8* 2.8*  DDIMER  --  0.71* 0.47  FERRITIN  --  983*  --   BNP 58.0  --  105.0*  PROCALCITON  --  <0.10  --     Hepatic Function Latest Ref Rng & Units 12/13/2019 12/12/2019 03/26/2019  Total Protein 6.5 - 8.1 g/dL 7.0 7.4 7.0  Albumin 3.5 - 5.0 g/dL 3.6 3.9 -  AST 15 - 41 U/L 81(H) 99(H) 24  ALT 0 - 44 U/L 92(H) 93(H)  27  Alk Phosphatase 38 - 126 U/L 46 51 -  Total Bilirubin 0.3 - 1.2 mg/dL 0.7 0.5 0.4  Bilirubin, Direct 0.0 -  0.3 mg/dL - - -    2.  Newly diagnosed A. fib with RVR.  Likely due to the stress of hypoxia.  Mali vas 2 score of  0 - he was started on IV Cardizem with good rate control, switch to oral Lopressor, placed on aspirin, will add PPI due to underlying history of GERD.  TSH stable will check echocardiogram with outpatient cardiology follow-up.  3.  GERD.  Placed on PPI.  4.  Mild COVID-19 viral pneumonia induced asymptomatic transaminitis.  Trend.  Also has fatty liver on ultrasound.  PCP to monitor.      Condition - Extremely Guarded  Family Communication  :  None  Code Status :  Full  Diet :   Diet Order            Diet regular Room service appropriate? Yes; Fluid consistency: Thin  Diet effective now               Disposition Plan  :  Home  Consults  :  None  Procedures  :     CTA - No PE  TTE -   Right upper quadrant ultrasound.  Fatty liver.  PUD Prophylaxis :   DVT Prophylaxis  :  Lovenox    Lab Results  Component Value Date   PLT 209 12/13/2019    Inpatient Medications  Scheduled Meds: . aspirin EC  325 mg Oral Daily  . dexamethasone (DECADRON) injection  6 mg Intravenous Q24H  . enoxaparin (LOVENOX) injection  0.5 mg/kg Subcutaneous Q24H  . metoprolol tartrate  50 mg Oral BID   Continuous Infusions: . remdesivir 100 mg in NS 100 mL 100 mg (12/13/19 0925)   PRN Meds:.albuterol, diltiazem  Antibiotics  :    Anti-infectives (From admission, onward)   Start     Dose/Rate Route Frequency Ordered Stop   12/13/19 1000  remdesivir 100 mg in sodium chloride 0.9 % 100 mL IVPB     100 mg 200 mL/hr over 30 Minutes Intravenous Daily 12/12/19 0558 12/17/19 0959   12/12/19 0630  remdesivir 200 mg in sodium chloride 0.9% 250 mL IVPB     200 mg 580 mL/hr over 30 Minutes Intravenous Once 12/12/19 0558 12/12/19 0749       Time Spent in  minutes  30   Lala Lund M.D on 12/13/2019 at 9:44 AM  To page go to [email or web address] - password [facility]  [facility] -  Office  [phone]    See all Orders from today for further details    Objective:   Vitals:   12/12/19 2339 12/13/19 0400 12/13/19 0500 12/13/19 0732  BP: 120/79 100/64  110/77  Pulse: 88 68 71 84  Resp: '17 20 17 19  '$ Temp: 98.4 F (36.9 C) 97.9 F (36.6 C)  97.9 F (36.6 C)  TempSrc: Oral Oral  Oral  SpO2: 96% 95%  91%  Weight:      Height:        Wt Readings from Last 3 Encounters:  12/12/19 108.9 kg  02/12/19 110.7 kg  11/29/18 113.9 kg     Intake/Output Summary (Last 24 hours) at 12/13/2019 0944 Last data filed at 12/13/2019 0415 Gross per 24 hour  Intake 381.61 ml  Output -  Net 381.61 ml     Physical Exam  Awake Alert, Oriented X 3, No new F.N deficits, Normal affect [place].AT,PERRAL Supple Neck,No JVD, No cervical lymphadenopathy appriciated.  Symmetrical Chest wall movement, Good air movement bilaterally, CTAB iRRR,No Gallops,Rubs  or new Murmurs, No Parasternal Heave +ve B.Sounds, Abd Soft, No tenderness, No organomegaly appriciated, No rebound - guarding or rigidity. No Cyanosis, Clubbing or edema, No new Rash or bruise       Data Review:    CBC Recent Labs  Lab 12/12/19 0026 12/13/19 0337  WBC 3.6* 3.7*  HGB 13.9 12.9*  HCT 40.4 37.7*  PLT 191 209  MCV 92.0 92.6  MCH 31.7 31.7  MCHC 34.4 34.2  RDW 12.1 12.5  LYMPHSABS 0.6* 0.9  MONOABS 0.3 0.4  EOSABS 0.0 0.0  BASOSABS 0.0 0.0    Chemistries  Recent Labs  Lab 12/12/19 0026 12/13/19 0337  NA 132* 135  K 3.8 3.6  CL 98 101  CO2 23 24  GLUCOSE 121* 107*  BUN 19 21  CREATININE 0.93 0.61  CALCIUM 8.7* 8.8*  MG  --  2.2  AST 99* 81*  ALT 93* 92*  ALKPHOS 51 46  BILITOT 0.5 0.7   ------------------------------------------------------------------------------------------------------------------ No results for input(s): CHOL, HDL, LDLCALC, TRIG, CHOLHDL,  LDLDIRECT in the last 72 hours.  No results found for: HGBA1C ------------------------------------------------------------------------------------------------------------------ Recent Labs    12/12/19 1556  TSH 1.768    Cardiac Enzymes No results for input(s): CKMB, TROPONINI, MYOGLOBIN in the last 168 hours.  Invalid input(s): CK ------------------------------------------------------------------------------------------------------------------    Component Value Date/Time   BNP 105.0 (H) 12/13/2019 3785    Micro Results Recent Results (from the past 240 hour(s))  Culture, blood (Routine X 2) w Reflex to ID Panel     Status: None (Preliminary result)   Collection Time: 12/12/19  5:57 AM   Specimen: BLOOD  Result Value Ref Range Status   Specimen Description BLOOD RIGHT ANTECUBITAL  Final   Special Requests   Final    BOTTLES DRAWN AEROBIC AND ANAEROBIC Blood Culture adequate volume   Culture   Final    NO GROWTH < 24 HOURS Performed at [facility], [address] [place], [place] [address]    Report Status PENDING  Incomplete  Culture, blood (Routine X 2) w Reflex to ID Panel     Status: None (Preliminary result)   Collection Time: 12/12/19  6:02 AM   Specimen: BLOOD  Result Value Ref Range Status   Specimen Description BLOOD LEFT ANTECUBITAL  Final   Special Requests   Final    BOTTLES DRAWN AEROBIC AND ANAEROBIC Blood Culture adequate volume   Culture   Final    NO GROWTH < 24 HOURS Performed at [facility], [place] [address], , [place] [address]    Report Status PENDING  Incomplete    Radiology Reports CT Angio Chest PE W and/or Wo Contrast  Result Date: 12/12/2019 CLINICAL DATA:  Cough.  COVID positive EXAM: CT ANGIOGRAPHY CHEST WITH CONTRAST TECHNIQUE: Multidetector CT imaging of the chest was performed using the standard protocol during bolus administration of intravenous contrast. Multiplanar CT image reconstructions and MIPs were obtained to  evaluate the vascular anatomy. CONTRAST:  175m OMNIPAQUE IOHEXOL 350 MG/ML SOLN COMPARISON:  None. FINDINGS: Cardiovascular: Normal heart size. No pericardial effusion. Aortic and coronary atherosclerotic calcification. No pulmonary artery filling defect. Aortic atherosclerotic calcification. Mediastinum/Nodes: Negative for adenopathy or mass. Lungs/Pleura: Ground-glass and streaky opacities in the bilateral lungs correlating with COVID-19 positivity. No edema or effusion. Upper Abdomen: Negative Musculoskeletal: Negative Review of the MIP images confirms the above findings. IMPRESSION: 1. Bilateral pneumonia correlating with COVID-19 positivity. 2. Negative for pulmonary embolism. Electronically Signed   By: JAngelica Chessman  Watts M.D.   On: 12/12/2019 09:26   DG Chest Port 1 View  Result Date: 12/11/2019 CLINICAL DATA:  Cough and shortness of breath. EXAM: PORTABLE CHEST 1 VIEW COMPARISON:  A twenty-third 2016 FINDINGS: Very mild hazy infiltrate is seen within the right lung base and lateral aspect of the lower left lung. There is no evidence of a pleural effusion or pneumothorax. The heart size and mediastinal contours are within normal limits. The visualized skeletal structures are unremarkable. IMPRESSION: 1. Very mild, hazy bibasilar infiltrates. Electronically Signed   By: Virgina Norfolk M.D.   On: 12/11/2019 22:41   US Abdomen Limited RUQ  Result Date: 12/12/2019 CLINICAL DATA:  Elevated liver enzymes EXAM: ULTRASOUND ABDOMEN LIMITED RIGHT UPPER QUADRANT COMPARISON:  None. FINDINGS: Gallbladder: No gallstones or wall thickening visualized. There is no pericholecystic fluid. No sonographic Murphy sign noted by sonographer. Common bile duct: Diameter: 3 mm. No intrahepatic or extrahepatic biliary duct dilatation. Liver: No focal lesion identified. Liver echogenicity overall is increased. Portal vein is patent on color Doppler imaging with normal direction of blood flow towards the liver. Other: None.  IMPRESSION: Diffuse increase in liver echogenicity, a finding indicative of hepatic steatosis. No focal liver lesions are evident; it must be cautioned that the sensitivity of ultrasound for detection of focal liver lesions is somewhat diminished in this circumstance. Study otherwise unremarkable. Electronically Signed   By: Lowella Grip III M.D.   On: 12/12/2019 08:13

## 2019-12-14 ENCOUNTER — Other Ambulatory Visit: Payer: Self-pay

## 2019-12-14 LAB — COMPREHENSIVE METABOLIC PANEL
ALT: 92 U/L — ABNORMAL HIGH (ref 0–44)
AST: 61 U/L — ABNORMAL HIGH (ref 15–41)
Albumin: 3.5 g/dL (ref 3.5–5.0)
Alkaline Phosphatase: 47 U/L (ref 38–126)
Anion gap: 10 (ref 5–15)
BUN: 21 mg/dL (ref 8–23)
CO2: 23 mmol/L (ref 22–32)
Calcium: 8.9 mg/dL (ref 8.9–10.3)
Chloride: 102 mmol/L (ref 98–111)
Creatinine, Ser: 0.68 mg/dL (ref 0.61–1.24)
GFR calc Af Amer: >60 mL/min (ref >60)
GFR calc non Af Amer: >60 mL/min (ref >60)
Glucose, Bld: 125 mg/dL — ABNORMAL HIGH (ref 70–99)
Potassium: 4.1 mmol/L (ref 3.5–5.1)
Sodium: 135 mmol/L (ref 135–145)
Total Bilirubin: 0.5 mg/dL (ref 0.3–1.2)
Total Protein: 6.9 g/dL (ref 6.5–8.1)

## 2019-12-14 LAB — CBC WITH DIFFERENTIAL/PLATELET
Abs Immature Granulocytes: 0.02 10*3/uL (ref 0.00–0.07)
Basophils Absolute: 0 10*3/uL (ref 0.0–0.1)
Basophils Relative: 0 %
Eosinophils Absolute: 0 10*3/uL (ref 0.0–0.5)
Eosinophils Relative: 0 %
HCT: 38.3 % — ABNORMAL LOW (ref 39.0–52.0)
Hemoglobin: 13.3 g/dL (ref 13.0–17.0)
Immature Granulocytes: 0 %
Lymphocytes Relative: 16 %
Lymphs Abs: 0.8 10*3/uL (ref 0.7–4.0)
MCH: 31.9 pg (ref 26.0–34.0)
MCHC: 34.7 g/dL (ref 30.0–36.0)
MCV: 91.8 fL (ref 80.0–100.0)
Monocytes Absolute: 0.5 10*3/uL (ref 0.1–1.0)
Monocytes Relative: 9 %
Neutro Abs: 4 10*3/uL (ref 1.7–7.7)
Neutrophils Relative %: 75 %
Platelets: 246 10*3/uL (ref 150–400)
RBC: 4.17 MIL/uL — ABNORMAL LOW (ref 4.22–5.81)
RDW: 12.4 % (ref 11.5–15.5)
WBC: 5.3 10*3/uL (ref 4.0–10.5)
nRBC: 0 % (ref 0.0–0.2)

## 2019-12-14 LAB — BRAIN NATRIURETIC PEPTIDE: B Natriuretic Peptide: 242.5 pg/mL — ABNORMAL HIGH (ref 0.0–100.0)

## 2019-12-14 LAB — MAGNESIUM: Magnesium: 2 mg/dL (ref 1.7–2.4)

## 2019-12-14 LAB — D-DIMER, QUANTITATIVE: D-Dimer, Quant: 0.43 ug/mL-FEU (ref 0.00–0.50)

## 2019-12-14 LAB — C-REACTIVE PROTEIN: CRP: 1.5 mg/dL — ABNORMAL HIGH (ref <1.0)

## 2019-12-14 MED ORDER — METOPROLOL TARTRATE 50 MG PO TABS
100.0000 mg | ORAL_TABLET | Freq: Two times a day (BID) | ORAL | Status: DC
  Administered 2019-12-14 – 2019-12-15 (×3): 100 mg via ORAL
  Filled 2019-12-14 (×3): qty 2

## 2019-12-14 MED ORDER — DEXAMETHASONE SODIUM PHOSPHATE 4 MG/ML IJ SOLN
3.0000 mg | INTRAMUSCULAR | Status: DC
  Administered 2019-12-15: 3 mg via INTRAVENOUS
  Filled 2019-12-14: qty 1

## 2019-12-14 NOTE — Progress Notes (Signed)
PROGRESS NOTE                                                                                                                                                                                                             Patient Demographics:    Shane Gonzalez, is a 64 y.o. male, DOB - November 09, 1956, JSH:[id number]  Outpatient Primary MD for the patient is Susan Moore, Modena Nunnery, MD    LOS - 2  Admit date - 12/11/2019    CC - SOB     Brief Narrative  - 64 year old Caucasian male with history of GERD, diverticulosis, dyslipidemia of breath was diagnosed with acute hypoxic respiratory failure due to COVID-19 pneumonia and admitted to the hospital.  Post admission developed new onset A. fib RVR.   Subjective:   Patient in bed, appears comfortable, denies any headache, no fever, no chest pain or pressure, no shortness of breath , no abdominal pain. No focal weakness.    Assessment  & Plan :     1. Acute Hypoxic Resp. Failure due to Acute Covid 19 Viral Pneumonitis during the ongoing 2020 Covid 19 Pandemic - he had mild to moderate disease, responding well to IV steroids and remdesivir which will be continued. Start tapering steroids.  Encouraged the patient to sit up in chair in the daytime use I-S and flutter valve for pulmonary toiletry and then prone in bed when at night.    SpO2: 94 % O2 Flow Rate (L/min): 1 L/min  Recent Labs  Lab 12/12/19 0027 12/12/19 0557 12/13/19 0337 12/14/19 0306  CRP  --  2.8* 2.8* 1.5*  DDIMER  --  0.71* 0.47 0.43  FERRITIN  --  983*  --   --   BNP 58.0  --  105.0* 242.5*  PROCALCITON  --  <0.10  --   --     Hepatic Function Latest Ref Rng & Units 12/14/2019 12/13/2019 12/12/2019  Total Protein 6.5 - 8.1 g/dL 6.9 7.0 7.4  Albumin 3.5 - 5.0 g/dL 3.5 3.6 3.9  AST 15 - 41 U/L 61(H) 81(H) 99(H)  ALT 0 - 44 U/L 92(H) 92(H) 93(H)  Alk Phosphatase 38 - 126 U/L 47 46 51  Total Bilirubin 0.3 - 1.2 mg/dL 0.5  0.7 0.5  Bilirubin, Direct 0.0 - 0.3 mg/dL - - -    2.  Newly  diagnosed A. fib with RVR.  Likely due to the stress of hypoxia.  Mali vas 2 score of  0 - he was started on IV Cardizem with good rate control, switched to oral Lopressor dose doubled 12/14/19 for better control, placed on aspirin, will add PPI due to underlying history of GERD.  TSH & TTE stable, outpatient cardiology follow-up.  3.  GERD.  Placed on PPI.  4.  Mild COVID-19 viral pneumonia induced asymptomatic transaminitis.  Trend.  Also has fatty liver on ultrasound.  PCP to monitor.      Condition - Extremely Guarded  Family Communication  :  None  Code Status :  Full  Diet :   Diet Order            Diet regular Room service appropriate? Yes; Fluid consistency: Thin  Diet effective now               Disposition Plan  :  Home  Consults  :  None  Procedures  :     CTA - No PE  TTE -    1. Left ventricular ejection fraction, by visual estimation, is 60 to 65%. The left ventricle has normal function. There is moderately increased left ventricular hypertrophy.  2. Left ventricular diastolic parameters are indeterminate.  3. The left ventricle has no regional wall motion abnormalities.  4. Global right ventricle has normal systolic function.The right ventricular size is normal.  5. Left atrial size was normal.  6. Right atrial size was normal.  7. The mitral valve is normal in structure. Trivial mitral valve regurgitation. No evidence of mitral stenosis.  8. The tricuspid valve is normal in structure.  9. The aortic valve is tricuspid. Aortic valve regurgitation is not visualized. Mild to moderate aortic valve sclerosis/calcification without any evidence of aortic stenosis. 10. The pulmonic valve was normal in structure. Pulmonic valve regurgitation is not visualized. 11. Normal LV function; moderate LVH.  Right upper quadrant ultrasound.  Fatty liver.  PUD Prophylaxis :   DVT Prophylaxis  :  Lovenox     Lab Results  Component Value Date   PLT 246 12/14/2019    Inpatient Medications  Scheduled Meds: . aspirin EC  325 mg Oral Daily  . [START ON 12/15/2019] dexamethasone (DECADRON) injection  3 mg Intravenous Q24H  . enoxaparin (LOVENOX) injection  0.5 mg/kg Subcutaneous Q24H  . metoprolol tartrate  100 mg Oral BID  . pantoprazole  40 mg Oral Daily   Continuous Infusions: . remdesivir 100 mg in NS 100 mL Stopped (12/13/19 1031)   PRN Meds:.albuterol, diltiazem  Antibiotics  :    Anti-infectives (From admission, onward)   Start     Dose/Rate Route Frequency Ordered Stop   12/13/19 1000  remdesivir 100 mg in sodium chloride 0.9 % 100 mL IVPB     100 mg 200 mL/hr over 30 Minutes Intravenous Daily 12/12/19 0558 12/17/19 0959   12/12/19 0630  remdesivir 200 mg in sodium chloride 0.9% 250 mL IVPB     200 mg 580 mL/hr over 30 Minutes Intravenous Once 12/12/19 0558 12/12/19 0749       Time Spent in minutes  30   Lala Lund M.D on 12/14/2019 at 10:00 AM  To page go to [email or web address] - password [facility]  [facility] -  Office  [phone]    See all Orders from today for further details    Objective:   Vitals:   12/13/19 1959 12/13/19 2116 12/14/19 0448 12/14/19 3953  BP: 130/83 137/90 138/89 118/90  Pulse: 93 91 77 96  Resp: '18  20 20  '$ Temp: 98.1 F (36.7 C)  98.4 F (36.9 C) 97.9 F (36.6 C)  TempSrc: Oral  Oral Oral  SpO2: 94%  93% 94%  Weight:      Height:        Wt Readings from Last 3 Encounters:  12/12/19 108.9 kg  02/12/19 110.7 kg  11/29/18 113.9 kg     Intake/Output Summary (Last 24 hours) at 12/14/2019 1000 Last data filed at 12/13/2019 2000 Gross per 24 hour  Intake 0 ml  Output --  Net 0 ml     Physical Exam  Awake Alert, Oriented X 3, No new F.N deficits, Normal affect [place].AT,PERRAL Supple Neck,No JVD, No cervical lymphadenopathy appriciated.  Symmetrical Chest wall movement, Good air movement bilaterally, CTAB iRRR,No  Gallops, Rubs or new Murmurs, No Parasternal Heave +ve B.Sounds, Abd Soft, No tenderness, No organomegaly appriciated, No rebound - guarding or rigidity. No Cyanosis, Clubbing or edema, No new Rash or bruise    Data Review:    CBC Recent Labs  Lab 12/12/19 0026 12/13/19 0337 12/14/19 0306  WBC 3.6* 3.7* 5.3  HGB 13.9 12.9* 13.3  HCT 40.4 37.7* 38.3*  PLT 191 209 246  MCV 92.0 92.6 91.8  MCH 31.7 31.7 31.9  MCHC 34.4 34.2 34.7  RDW 12.1 12.5 12.4  LYMPHSABS 0.6* 0.9 0.8  MONOABS 0.3 0.4 0.5  EOSABS 0.0 0.0 0.0  BASOSABS 0.0 0.0 0.0    Chemistries  Recent Labs  Lab 12/12/19 0026 12/13/19 0337 12/14/19 0306  NA 132* 135 135  K 3.8 3.6 4.1  CL 98 101 102  CO2 '23 24 23  '$ GLUCOSE 121* 107* 125*  BUN '19 21 21  '$ CREATININE 0.93 0.61 0.68  CALCIUM 8.7* 8.8* 8.9  MG  --  2.2 2.0  AST 99* 81* 61*  ALT 93* 92* 92*  ALKPHOS 51 46 47  BILITOT 0.5 0.7 0.5   ------------------------------------------------------------------------------------------------------------------ No results for input(s): CHOL, HDL, LDLCALC, TRIG, CHOLHDL, LDLDIRECT in the last 72 hours.  No results found for: HGBA1C ------------------------------------------------------------------------------------------------------------------ Recent Labs    12/12/19 1556  TSH 1.768    Cardiac Enzymes No results for input(s): CKMB, TROPONINI, MYOGLOBIN in the last 168 hours.  Invalid input(s): CK ------------------------------------------------------------------------------------------------------------------    Component Value Date/Time   BNP 242.5 (H) 12/14/2019 6468    Micro Results Recent Results (from the past 240 hour(s))  Culture, blood (Routine X 2) w Reflex to ID Panel     Status: None (Preliminary result)   Collection Time: 12/12/19  5:57 AM   Specimen: BLOOD  Result Value Ref Range Status   Specimen Description BLOOD RIGHT ANTECUBITAL  Final   Special Requests   Final    BOTTLES DRAWN  AEROBIC AND ANAEROBIC Blood Culture adequate volume   Culture   Final    NO GROWTH < 24 HOURS Performed at [facility], [address], [place], [place] [address]    Report Status PENDING  Incomplete  Culture, blood (Routine X 2) w Reflex to ID Panel     Status: None (Preliminary result)   Collection Time: 12/12/19  6:02 AM   Specimen: BLOOD  Result Value Ref Range Status   Specimen Description BLOOD LEFT ANTECUBITAL  Final   Special Requests   Final    BOTTLES DRAWN AEROBIC AND ANAEROBIC Blood Culture adequate volume   Culture   Final    NO  GROWTH < 24 HOURS Performed at [facility], [place] [address], [place], [place] [address]    Report Status PENDING  Incomplete    Radiology Reports CT Angio Chest PE W and/or Wo Contrast  Result Date: 12/12/2019 CLINICAL DATA:  Cough.  COVID positive EXAM: CT ANGIOGRAPHY CHEST WITH CONTRAST TECHNIQUE: Multidetector CT imaging of the chest was performed using the standard protocol during bolus administration of intravenous contrast. Multiplanar CT image reconstructions and MIPs were obtained to evaluate the vascular anatomy. CONTRAST:  128m OMNIPAQUE IOHEXOL 350 MG/ML SOLN COMPARISON:  None. FINDINGS: Cardiovascular: Normal heart size. No pericardial effusion. Aortic and coronary atherosclerotic calcification. No pulmonary artery filling defect. Aortic atherosclerotic calcification. Mediastinum/Nodes: Negative for adenopathy or mass. Lungs/Pleura: Ground-glass and streaky opacities in the bilateral lungs correlating with COVID-19 positivity. No edema or effusion. Upper Abdomen: Negative Musculoskeletal: Negative Review of the MIP images confirms the above findings. IMPRESSION: 1. Bilateral pneumonia correlating with COVID-19 positivity. 2. Negative for pulmonary embolism. Electronically Signed   By: JMonte FantasiaM.D.   On: 12/12/2019 09:26   DG Chest Port 1 View  Result Date: 12/11/2019 CLINICAL DATA:  Cough and shortness of breath. EXAM:  PORTABLE CHEST 1 VIEW COMPARISON:  A twenty-third 2016 FINDINGS: Very mild hazy infiltrate is seen within the right lung base and lateral aspect of the lower left lung. There is no evidence of a pleural effusion or pneumothorax. The heart size and mediastinal contours are within normal limits. The visualized skeletal structures are unremarkable. IMPRESSION: 1. Very mild, hazy bibasilar infiltrates. Electronically Signed   By: TVirgina NorfolkM.D.   On: 12/11/2019 22:41   ECHOCARDIOGRAM COMPLETE  Result Date: 12/13/2019   ECHOCARDIOGRAM REPORT   Patient Name:   Shane UAlton RevereDate of Exam: 12/13/2019 Medical Rec #:  [id number]  Height:       70.0 in Accession #:    [id number] Weight:       240.0 lb Date of Birth:  [birth year]  BSA:          2.26 m Patient Age:    89years    BP:           110/77 mmHg Patient Gender: M           HR:           84 bpm. Exam Location:  Inpatient Procedure: 2D Echo Indications:    Atrial Fibrillation  History:        Patient has no prior history of Echocardiogram examinations.                 Risk Factors:Dyslipidemia and Current Smoker. COVID-19.  Sonographer:    AClayton LefortRDCS (AE) Referring Phys: [id number] PMargaree MackintoshSPatient Care Associates LLC Sonographer Comments: Suboptimal subcostal window and patient is morbidly obese. COVID-19 IMPRESSIONS  1. Left ventricular ejection fraction, by visual estimation, is 60 to 65%. The left ventricle has normal function. There is moderately increased left ventricular hypertrophy.  2. Left ventricular diastolic parameters are indeterminate.  3. The left ventricle has no regional wall motion abnormalities.  4. Global right ventricle has normal systolic function.The right ventricular size is normal.  5. Left atrial size was normal.  6. Right atrial size was normal.  7. The mitral valve is normal in structure. Trivial mitral valve regurgitation. No evidence of mitral stenosis.  8. The tricuspid valve is normal in structure.  9. The aortic valve is tricuspid. Aortic valve  regurgitation is not visualized. Mild to  moderate aortic valve sclerosis/calcification without any evidence of aortic stenosis. 10. The pulmonic valve was normal in structure. Pulmonic valve regurgitation is not visualized. 11. Normal LV function; moderate LVH. FINDINGS  Left Ventricle: Left ventricular ejection fraction, by visual estimation, is 60 to 65%. The left ventricle has normal function. The left ventricle has no regional wall motion abnormalities. There is moderately increased left ventricular hypertrophy. Left ventricular diastolic parameters are indeterminate. Normal left atrial pressure. Right Ventricle: The right ventricular size is normal.Global RV systolic function is has normal systolic function. Left Atrium: Left atrial size was normal in size. Right Atrium: Right atrial size was normal in size Pericardium: There is no evidence of pericardial effusion. Mitral Valve: The mitral valve is normal in structure. Trivial mitral valve regurgitation. No evidence of mitral valve stenosis by observation. Tricuspid Valve: The tricuspid valve is normal in structure. Tricuspid valve regurgitation is trivial. Aortic Valve: The aortic valve is tricuspid. Aortic valve regurgitation is not visualized. Mild to moderate aortic valve sclerosis/calcification is present, without any evidence of aortic stenosis. Pulmonic Valve: The pulmonic valve was normal in structure. Pulmonic valve regurgitation is not visualized. Pulmonic regurgitation is not visualized. Aorta: The aortic root is normal in size and structure. Venous: The inferior vena cava was not well visualized. IAS/Shunts: No atrial level shunt detected by color flow Doppler. Additional Comments: Normal LV function; moderate LVH.  LEFT VENTRICLE PLAX 2D LVIDd:         4.57 cm LVIDs:         2.84 cm LV PW:         1.44 cm LV IVS:        1.44 cm LVOT diam:     2.10 cm LV SV:         65 ml LV SV Index:   27.69 LVOT Area:     3.46 cm  RIGHT VENTRICLE RV Basal diam:   3.35 cm RV S prime:     11.40 cm/s TAPSE (M-mode): 1.8 cm LEFT ATRIUM             Index       RIGHT ATRIUM           Index LA diam:        3.70 cm 1.64 cm/m  RA Area:     17.50 cm LA Vol (A2C):   42.9 ml 19.02 ml/m RA Volume:   46.00 ml  20.39 ml/m LA Vol (A4C):   48.0 ml 21.28 ml/m LA Biplane Vol: 46.9 ml 20.79 ml/m  AORTIC VALVE LVOT Vmax:   84.98 cm/s LVOT Vmean:  56.540 cm/s LVOT VTI:    0.157 m  AORTA Ao Root diam: 3.20 cm  SHUNTS Systemic VTI:  0.16 m Systemic Diam: 2.10 cm  Kirk Ruths MD Electronically signed by Kirk Ruths MD Signature Date/Time: 12/13/2019/1:55:30 PM    Final    US Abdomen Limited RUQ  Result Date: 12/12/2019 CLINICAL DATA:  Elevated liver enzymes EXAM: ULTRASOUND ABDOMEN LIMITED RIGHT UPPER QUADRANT COMPARISON:  None. FINDINGS: Gallbladder: No gallstones or wall thickening visualized. There is no pericholecystic fluid. No sonographic Murphy sign noted by sonographer. Common bile duct: Diameter: 3 mm. No intrahepatic or extrahepatic biliary duct dilatation. Liver: No focal lesion identified. Liver echogenicity overall is increased. Portal vein is patent on color Doppler imaging with normal direction of blood flow towards the liver. Other: None. IMPRESSION: Diffuse increase in liver echogenicity, a finding indicative of hepatic steatosis. No focal liver lesions are evident; it must  be cautioned that the sensitivity of ultrasound for detection of focal liver lesions is somewhat diminished in this circumstance. Study otherwise unremarkable. Electronically Signed   By: Lowella Grip III M.D.   On: 12/12/2019 08:13

## 2019-12-15 DIAGNOSIS — I480 Paroxysmal atrial fibrillation: Secondary | ICD-10-CM | POA: Diagnosis not present

## 2019-12-15 LAB — COMPREHENSIVE METABOLIC PANEL
ALT: 147 U/L — ABNORMAL HIGH (ref 0–44)
AST: 70 U/L — ABNORMAL HIGH (ref 15–41)
Albumin: 3.6 g/dL (ref 3.5–5.0)
Alkaline Phosphatase: 47 U/L (ref 38–126)
Anion gap: 14 (ref 5–15)
BUN: 22 mg/dL (ref 8–23)
CO2: 22 mmol/L (ref 22–32)
Calcium: 8.7 mg/dL — ABNORMAL LOW (ref 8.9–10.3)
Chloride: 100 mmol/L (ref 98–111)
Creatinine, Ser: 0.86 mg/dL (ref 0.61–1.24)
GFR calc Af Amer: >60 mL/min (ref >60)
GFR calc non Af Amer: >60 mL/min (ref >60)
Glucose, Bld: 110 mg/dL — ABNORMAL HIGH (ref 70–99)
Potassium: 3.9 mmol/L (ref 3.5–5.1)
Sodium: 136 mmol/L (ref 135–145)
Total Bilirubin: 1 mg/dL (ref 0.3–1.2)
Total Protein: 7.1 g/dL (ref 6.5–8.1)

## 2019-12-15 LAB — CBC WITH DIFFERENTIAL/PLATELET
Abs Immature Granulocytes: 0.05 10*3/uL (ref 0.00–0.07)
Basophils Absolute: 0 10*3/uL (ref 0.0–0.1)
Basophils Relative: 0 %
Eosinophils Absolute: 0 10*3/uL (ref 0.0–0.5)
Eosinophils Relative: 0 %
HCT: 41.2 % (ref 39.0–52.0)
Hemoglobin: 14.1 g/dL (ref 13.0–17.0)
Immature Granulocytes: 1 %
Lymphocytes Relative: 15 %
Lymphs Abs: 1.2 10*3/uL (ref 0.7–4.0)
MCH: 31.5 pg (ref 26.0–34.0)
MCHC: 34.2 g/dL (ref 30.0–36.0)
MCV: 92 fL (ref 80.0–100.0)
Monocytes Absolute: 0.5 10*3/uL (ref 0.1–1.0)
Monocytes Relative: 7 %
Neutro Abs: 5.8 10*3/uL (ref 1.7–7.7)
Neutrophils Relative %: 77 %
Platelets: 288 10*3/uL (ref 150–400)
RBC: 4.48 MIL/uL (ref 4.22–5.81)
RDW: 12.5 % (ref 11.5–15.5)
WBC: 7.5 10*3/uL (ref 4.0–10.5)
nRBC: 0 % (ref 0.0–0.2)

## 2019-12-15 LAB — C-REACTIVE PROTEIN: CRP: 0.7 mg/dL (ref <1.0)

## 2019-12-15 LAB — BRAIN NATRIURETIC PEPTIDE: B Natriuretic Peptide: 273.1 pg/mL — ABNORMAL HIGH (ref 0.0–100.0)

## 2019-12-15 LAB — D-DIMER, QUANTITATIVE: D-Dimer, Quant: 0.49 ug/mL-FEU (ref 0.00–0.50)

## 2019-12-15 LAB — MAGNESIUM: Magnesium: 2.1 mg/dL (ref 1.7–2.4)

## 2019-12-15 MED ORDER — ASPIRIN 325 MG PO TBEC: 325.0000 mg | DELAYED_RELEASE_TABLET | Freq: Every day | ORAL | 0 refills | Status: DC

## 2019-12-15 MED ORDER — BENZONATATE 100 MG PO CAPS: 200.0000 mg | ORAL_CAPSULE | Freq: Three times a day (TID) | ORAL | Status: DC | PRN

## 2019-12-15 MED ORDER — METOPROLOL TARTRATE 100 MG PO TABS: 100.0000 mg | ORAL_TABLET | Freq: Two times a day (BID) | ORAL | 0 refills | Status: DC

## 2019-12-15 MED ORDER — ALBUTEROL SULFATE HFA 108 (90 BASE) MCG/ACT IN AERS: 1.0000 | INHALATION_SPRAY | Freq: Four times a day (QID) | RESPIRATORY_TRACT | Status: DC | PRN

## 2019-12-15 MED ORDER — PANTOPRAZOLE SODIUM 20 MG PO TBEC: 20.0000 mg | DELAYED_RELEASE_TABLET | Freq: Every day | ORAL | 0 refills | Status: DC

## 2019-12-15 NOTE — Discharge Instructions (Signed)
You are scheduled for an outpatient infusion of Remdesivir at 1130AM on Tuesday 1/12.  Please report to [name] at [address]  Drive to the security guard and tell them you are here for an infusion. They will direct you to the front entrance where we will come and get you.  For questions call [phone].  Thanks    Follow with Primary MD Alycia Rossetti, MD in 7 days   Get CBC, CMP, 2 view Chest X ray -  checked next visit within 1 week by Primary MD    Activity: As tolerated with Full fall precautions use walker/cane & assistance as needed  Disposition Home    Diet: Heart Healthy    Special Instructions: If you have smoked or chewed Tobacco  in the last 2 yrs please stop smoking, stop any regular Alcohol  and or any Recreational drug use.  On your next visit with your primary care physician please Get Medicines reviewed and adjusted.  Please request your Prim.MD to go over all Hospital Tests and Procedure/Radiological results at the follow up, please get all Hospital records sent to your Prim MD by signing hospital release before you go home.  If you experience worsening of your admission symptoms, develop shortness of breath, life threatening emergency, suicidal or homicidal thoughts you must seek medical attention immediately by calling 911 or calling your MD immediately  if symptoms less severe.  You Must read complete instructions/literature along with all the possible adverse reactions/side effects for all the Medicines you take and that have been prescribed to you. Take any new Medicines after you have completely understood and accpet all the possible adverse reactions/side effects.           Person Under Monitoring Name: Shane Gonzalez  Location: [address] [place] [address]   Infection Prevention Recommendations for Individuals Confirmed to have, or Being Evaluated for, 2019 Novel Coronavirus (COVID-19) Infection Who Receive Care at  Home  Individuals who are confirmed to have, or are being evaluated for, COVID-19 should follow the prevention steps below until a healthcare provider or local or state health department says they can return to normal activities.  Stay home except to get medical care You should restrict activities outside your home, except for getting medical care. Do not go to work, school, or public areas, and do not use public transportation or taxis.  Call ahead before visiting your doctor Before your medical appointment, call the healthcare provider and tell them that you have, or are being evaluated for, COVID-19 infection. This will help the healthcare providers office take steps to keep other people from getting infected. Ask your healthcare provider to call the local or state health department.  Monitor your symptoms Seek prompt medical attention if your illness is worsening (e.g., difficulty breathing). Before going to your medical appointment, call the healthcare provider and tell them that you have, or are being evaluated for, COVID-19 infection. Ask your healthcare provider to call the local or state health department.  Wear a facemask You should wear a facemask that covers your nose and mouth when you are in the same room with other people and when you visit a healthcare provider. People who live with or visit you should also wear a facemask while they are in the same room with you.  Separate yourself from other people in your home As much as possible, you should stay in a different room from other people in your home. Also,  you should use a separate bathroom, if available.  Avoid sharing household items You should not share dishes, drinking glasses, cups, eating utensils, towels, bedding, or other items with other people in your home. After using these items, you should wash them thoroughly with soap and water.  Cover your coughs and sneezes Cover your mouth and nose with a tissue  when you cough or sneeze, or you can cough or sneeze into your sleeve. Throw used tissues in a lined trash can, and immediately wash your hands with soap and water for at least 20 seconds or use an alcohol-based hand rub.  Wash your [facility] your hands often and thoroughly with soap and water for at least 20 seconds. You can use an alcohol-based hand sanitizer if soap and water are not available and if your hands are not visibly dirty. Avoid touching your eyes, nose, and mouth with unwashed hands.   Prevention Steps for Caregivers and Household Members of Individuals Confirmed to have, or Being Evaluated for, COVID-19 Infection Being Cared for in the Home  If you live with, or provide care at home for, a person confirmed to have, or being evaluated for, COVID-19 infection please follow these guidelines to prevent infection:  Follow healthcare providers instructions Make sure that you understand and can help the patient follow any healthcare provider instructions for all care.  Provide for the patients basic needs You should help the patient with basic needs in the home and provide support for getting groceries, prescriptions, and other personal needs.  Monitor the patients symptoms If they are getting sicker, call his or her medical provider and tell them that the patient has, or is being evaluated for, COVID-19 infection. This will help the healthcare providers office take steps to keep other people from getting infected. Ask the healthcare provider to call the local or state health department.  Limit the number of people who have contact with the patient  If possible, have only one caregiver for the patient.  Other household members should stay in another home or place of residence. If this is not possible, they should stay  in another room, or be separated from the patient as much as possible. Use a separate bathroom, if available.  Restrict visitors who do not have an  essential need to be in the home.  Keep older adults, very young children, and other sick people away from the patient Keep older adults, very young children, and those who have compromised immune systems or chronic health conditions away from the patient. This includes people with chronic heart, lung, or kidney conditions, diabetes, and cancer.  Ensure good ventilation Make sure that shared spaces in the home have good air flow, such as from an air conditioner or an opened window, weather permitting.  Wash your hands often  Wash your hands often and thoroughly with soap and water for at least 20 seconds. You can use an alcohol based hand sanitizer if soap and water are not available and if your hands are not visibly dirty.  Avoid touching your eyes, nose, and mouth with unwashed hands.  Use disposable paper towels to dry your hands. If not available, use dedicated cloth towels and replace them when they become wet.  Wear a facemask and gloves  Wear a disposable facemask at all times in the room and gloves when you touch or have contact with the patients blood, body fluids, and/or secretions or excretions, such as sweat, saliva, sputum, nasal mucus, vomit, urine, or feces.  Ensure the mask fits over your nose and mouth tightly, and do not touch it during use.  Throw out disposable facemasks and gloves after using them. Do not reuse.  Wash your hands immediately after removing your facemask and gloves.  If your personal clothing becomes contaminated, carefully remove clothing and launder. Wash your hands after handling contaminated clothing.  Place all used disposable facemasks, gloves, and other waste in a lined container before disposing them with other household waste.  Remove gloves and wash your hands immediately after handling these items.  Do not share dishes, glasses, or other household items with the patient  Avoid sharing household items. You should not share dishes,  drinking glasses, cups, eating utensils, towels, bedding, or other items with a patient who is confirmed to have, or being evaluated for, COVID-19 infection.  After the person uses these items, you should wash them thoroughly with soap and water.  Wash laundry thoroughly  Immediately remove and wash clothes or bedding that have blood, body fluids, and/or secretions or excretions, such as sweat, saliva, sputum, nasal mucus, vomit, urine, or feces, on them.  Wear gloves when handling laundry from the patient.  Read and follow directions on labels of laundry or clothing items and detergent. In general, wash and dry with the warmest temperatures recommended on the label.  Clean all areas the individual has used often  Clean all touchable surfaces, such as counters, tabletops, doorknobs, bathroom fixtures, toilets, phones, keyboards, tablets, and bedside tables, every day. Also, clean any surfaces that may have blood, body fluids, and/or secretions or excretions on them.  Wear gloves when cleaning surfaces the patient has come in contact with.  Use a diluted bleach solution (e.g., dilute bleach with 1 part bleach and 10 parts water) or a household disinfectant with a label that says EPA-registered for coronaviruses. To make a bleach solution at home, add 1 tablespoon of bleach to 1 quart (4 cups) of water. For a larger supply, add  cup of bleach to 1 gallon (16 cups) of water.  Read labels of cleaning products and follow recommendations provided on product labels. Labels contain instructions for safe and effective use of the cleaning product including precautions you should take when applying the product, such as wearing gloves or eye protection and making sure you have good ventilation during use of the product.  Remove gloves and wash hands immediately after cleaning.  Monitor yourself for signs and symptoms of illness Caregivers and household members are considered close contacts, should  monitor their health, and will be asked to limit movement outside of the home to the extent possible. Follow the monitoring steps for close contacts listed on the symptom monitoring form.   ? If you have additional questions, contact your local health department or call the epidemiologist on call at [phone] (available 24/7). ? This guidance is subject to change. For the most up-to-date guidance from [facility], please refer to their website: [email or web address]

## 2019-12-15 NOTE — Discharge Summary (Signed)
Shane Gonzalez CHT:[id number] DOB: [birth year] DOA: 12/11/2019  PCP: Alycia Rossetti, MD  Admit date: 12/11/2019  Discharge date: 12/15/2019  Admitted From: Home   Disposition:  Home   Recommendations for Outpatient Follow-up:   Follow up with PCP in 1-2 weeks  PCP Please obtain BMP/CBC, 2 view CXR in 1week,  (see Discharge instructions)   PCP Please follow up on the following pending results: Needs outpatient cardiology follow-up in a week or 2.  Monitor fatty liver.   Home Health: None   Equipment/Devices: None  Consultations: None  Discharge Condition: Stable    CODE STATUS: Full    Diet Recommendation: Heart Healthy   Diet Order            Diet - low sodium heart healthy        Diet regular Room service appropriate? Yes; Fluid consistency: Thin  Diet effective now               CC - SOB   Brief history of present illness from the day of admission and additional interim summary    64 year old Caucasian male with history of GERD, diverticulosis, dyslipidemia of breath was diagnosed with acute hypoxic respiratory failure due to COVID-19 pneumonia and admitted to the hospital.  Post admission developed new onset A. fib RVR.                                                                 Hospital Course    1. Acute Hypoxic Resp. Failure due to Acute Covid 19 Viral Pneumonitis during the ongoing 2020 Covid 19 Pandemic - he had mild to moderate disease, he was treated with IV steroids and remdesivir, finished his steroid course, stable on room air.  Will be discharged home and will finish his last course of remdesivir in the clinic tomorrow.    Recent Labs  Lab 12/12/19 0027 12/12/19 0557 12/13/19 0337 12/14/19 0306 12/15/19 0519  CRP  --  2.8* 2.8* 1.5* 0.7  DDIMER  --  0.71* 0.47 0.43 0.49    FERRITIN  --  983*  --   --   --   BNP 58.0  --  105.0* 242.5* 273.1*  PROCALCITON  --  <0.10  --   --   --     Hepatic Function Latest Ref Rng & Units 12/15/2019 12/14/2019 12/13/2019  Total Protein 6.5 - 8.1 g/dL 7.1 6.9 7.0  Albumin 3.5 - 5.0 g/dL 3.6 3.5 3.6  AST 15 - 41 U/L 70(H) 61(H) 81(H)  ALT 0 - 44 U/L 147(H) 92(H) 92(H)  Alk Phosphatase 38 - 126 U/L 47 47 46  Total Bilirubin 0.3 - 1.2 mg/dL 1.0 0.5 0.7  Bilirubin, Direct 0.0 - 0.3 mg/dL - - -     2.  Newly diagnosed A. fib  with RVR.  Likely due to the stress of hypoxia.  Mali vas 2 score of  0 - he was started on IV Cardizem with good rate control, switched to oral Lopressor dose doubled 12/14/19 for better control, placed on aspirin, will add PPI due to underlying history of GERD.  TSH & TTE stable, outpatient cardiology follow-up.  3.  GERD.  Placed on PPI.  4.  Mild COVID-19 viral pneumonia induced asymptomatic transaminitis.  Trend.  Also has fatty liver on ultrasound.  PCP to monitor.   CTA - No PE  TTE -   1. Left ventricular ejection fraction, by visual estimation, is 60 to 65%. The left ventricle has normal function. There is moderately increased left ventricular hypertrophy. 2. Left ventricular diastolic parameters are indeterminate. 3. The left ventricle has no regional wall motion abnormalities. 4. Global right ventricle has normal systolic function.The right ventricular size is normal. 5. Left atrial size was normal. 6. Right atrial size was normal. 7. The mitral valve is normal in structure. Trivial mitral valve regurgitation. No evidence of mitral stenosis. 8. The tricuspid valve is normal in structure. 9. The aortic valve is tricuspid. Aortic valve regurgitation is not visualized. Mild to moderate aortic valve sclerosis/calcification without any evidence of aortic stenosis. 10. The pulmonic valve was normal in structure. Pulmonic valve regurgitation is not visualized. 11. Normal LV function;  moderate LVH.  Right upper quadrant ultrasound.  Fatty liver.    Discharge diagnosis     Principal Problem:   Acute respiratory failure with hypoxia (HCC) Active Problems:   COVID-19 virus infection   Hyponatremia   Abnormal liver function   Atrial fibrillation with RVR [facility])    Discharge instructions    Discharge Instructions    Diet - low sodium heart healthy   Complete by: As directed    Discharge instructions   Complete by: As directed    You are scheduled for an outpatient infusion of Remdesivir at 1130AM on Tuesday 1/12.  Please [name] at [address]  Drive to the security guard and tell them you are here for an infusion. They will direct you to the front entrance where we will come and get you.  For questions call [phone].  Thanks    Follow with Primary MD Alycia Rossetti, MD in 7 days   Get CBC, CMP, 2 view Chest X ray -  checked next visit within 1 week by Primary MD    Activity: As tolerated with Full fall precautions use walker/cane & assistance as needed  Disposition Home    Diet: Heart Healthy    Special Instructions: If you have smoked or chewed Tobacco  in the last 2 yrs please stop smoking, stop any regular Alcohol  and or any Recreational drug use.  On your next visit with your primary care physician please Get Medicines reviewed and adjusted.  Please request your Prim.MD to go over all Hospital Tests and Procedure/Radiological results at the follow up, please get all Hospital records sent to your Prim MD by signing hospital release before you go home.  If you experience worsening of your admission symptoms, develop shortness of breath, life threatening emergency, suicidal or homicidal thoughts you must seek medical attention immediately by calling 911 or calling your MD immediately  if symptoms less severe.  You Must read complete instructions/literature along with all the possible adverse reactions/side effects  for all the Medicines you take and that have been prescribed to you.  Take any new Medicines after you have completely understood and accpet all the possible adverse reactions/side effects.   Increase activity slowly   Complete by: As directed    MyChart COVID-19 home monitoring program   Complete by: Dec 15, 2019    Is the patient willing to use the Person for home monitoring?: Yes   Temperature monitoring   Complete by: Dec 15, 2019    After how many days would you like to receive a notification of this patient's flowsheet entries?: 1      Discharge Medications   Allergies as of 12/15/2019   No Known Allergies     Medication List    STOP taking these medications   doxycycline 100 MG tablet Commonly known as: VIBRA-TABS   methylPREDNISolone 4 MG Tbpk tablet Commonly known as: MEDROL DOSEPAK   metroNIDAZOLE 1 % gel Commonly known as: Metrogel   terbinafine 250 MG tablet Commonly known as: LAMISIL   triamcinolone cream 0.1 % Commonly known as: KENALOG     TAKE these medications   albuterol 108 (90 Base) MCG/ACT inhaler Commonly known as: VENTOLIN HFA Inhale 1-2 puffs into the lungs every 6 (six) hours as needed for wheezing or shortness of breath.   aspirin 325 MG EC tablet Take 1 tablet (325 mg total) by mouth daily. Start taking on: December 16, 2019   benzonatate 200 MG capsule Commonly known as: TESSALON Take 200 mg by mouth 3 (three) times daily as needed for cough.   metoprolol tartrate 100 MG tablet Commonly known as: LOPRESSOR Take 1 tablet (100 mg total) by mouth 2 (two) times daily.   pantoprazole 20 MG tablet Commonly known as: Protonix Take 1 tablet (20 mg total) by mouth daily.       Follow-up Information    Lockhart, Modena Nunnery, MD. Schedule an appointment as soon as possible for a visit in 1 week(s).   Specialty: Family Medicine Contact information: [address] [place] [address] [phone]        Jerline Pain,  MD. Schedule an appointment as soon as possible for a visit in 2 week(s).   Specialty: Cardiology Why: Afib Contact information: [address] [place] [address] [place] [address] [phone]           Major procedures and Radiology Reports - PLEASE review detailed and final reports thoroughly  -         CT Angio Chest PE W and/or Wo Contrast  Result Date: 12/12/2019 CLINICAL DATA:  Cough.  COVID positive EXAM: CT ANGIOGRAPHY CHEST WITH CONTRAST TECHNIQUE: Multidetector CT imaging of the chest was performed using the standard protocol during bolus administration of intravenous contrast. Multiplanar CT image reconstructions and MIPs were obtained to evaluate the vascular anatomy. CONTRAST:  152m OMNIPAQUE IOHEXOL 350 MG/ML SOLN COMPARISON:  None. FINDINGS: Cardiovascular: Normal heart size. No pericardial effusion. Aortic and coronary atherosclerotic calcification. No pulmonary artery filling defect. Aortic atherosclerotic calcification. Mediastinum/Nodes: Negative for adenopathy or mass. Lungs/Pleura: Ground-glass and streaky opacities in the bilateral lungs correlating with COVID-19 positivity. No edema or effusion. Upper Abdomen: Negative Musculoskeletal: Negative Review of the MIP images confirms the above findings. IMPRESSION: 1. Bilateral pneumonia correlating with COVID-19 positivity. 2. Negative for pulmonary embolism. Electronically Signed   By: JMonte FantasiaM.D.   On: 12/12/2019 09:26   DG Chest Port 1 View  Result Date: 12/11/2019 CLINICAL DATA:  Cough and shortness of breath. EXAM: PORTABLE CHEST 1 VIEW COMPARISON:  A  twenty-third 2016 FINDINGS: Very mild hazy infiltrate is seen within the right lung base and lateral aspect of the lower left lung. There is no evidence of a pleural effusion or pneumothorax. The heart size and mediastinal contours are within normal limits. The visualized skeletal structures are unremarkable. IMPRESSION: 1. Very mild, hazy bibasilar infiltrates.  Electronically Signed   By: Virgina Norfolk M.D.   On: 12/11/2019 22:41   ECHOCARDIOGRAM COMPLETE  Result Date: 12/13/2019   ECHOCARDIOGRAM REPORT   Patient Name:   Shane Gonzalez Date of Exam: 12/13/2019 Medical Rec #:  [id number]   Height:       70.0 in Accession #:    [id number]  Weight:       240.0 lb Date of Birth:  15-Feb-1956   BSA:          2.26 m Patient Age:    17 years    BP:           110/77 mmHg Patient Gender: M           HR:           84 bpm. Exam Location:  Inpatient Procedure: 2D Echo Indications:    Atrial Fibrillation  History:        Patient has no prior history of Echocardiogram examinations.                 Risk Factors:Dyslipidemia and Current Smoker. COVID-19.  Sonographer:    Clayton Lefort RDCS (AE) Referring Phys: [id number]  Sonographer Comments: Suboptimal subcostal window and patient is morbidly obese. COVID-19 IMPRESSIONS  1. Left ventricular ejection fraction, by visual estimation, is 60 to 65%. The left ventricle has normal function. There is moderately increased left ventricular hypertrophy.  2. Left ventricular diastolic parameters are indeterminate.  3. The left ventricle has no regional wall motion abnormalities.  4. Global right ventricle has normal systolic function.The right ventricular size is normal.  5. Left atrial size was normal.  6. Right atrial size was normal.  7. The mitral valve is normal in structure. Trivial mitral valve regurgitation. No evidence of mitral stenosis.  8. The tricuspid valve is normal in structure.  9. The aortic valve is tricuspid. Aortic valve regurgitation is not visualized. Mild to moderate aortic valve sclerosis/calcification without any evidence of aortic stenosis. 10. The pulmonic valve was normal in structure. Pulmonic valve regurgitation is not visualized. 11. Normal LV function; moderate LVH. FINDINGS  Left Ventricle: Left ventricular ejection fraction, by visual estimation, is 60 to 65%. The left ventricle has normal function. The  left ventricle has no regional wall motion abnormalities. There is moderately increased left ventricular hypertrophy. Left ventricular diastolic parameters are indeterminate. Normal left atrial pressure. Right Ventricle: The right ventricular size is normal.Global RV systolic function is has normal systolic function. Left Atrium: Left atrial size was normal in size. Right Atrium: Right atrial size was normal in size Pericardium: There is no evidence of pericardial effusion. Mitral Valve: The mitral valve is normal in structure. Trivial mitral valve regurgitation. No evidence of mitral valve stenosis by observation. Tricuspid Valve: The tricuspid valve is normal in structure. Tricuspid valve regurgitation is trivial. Aortic Valve: The aortic valve is tricuspid. Aortic valve regurgitation is not visualized. Mild to moderate aortic valve sclerosis/calcification is present, without any evidence of aortic stenosis. Pulmonic Valve: The pulmonic valve was normal in structure. Pulmonic valve regurgitation is not visualized. Pulmonic regurgitation is not visualized. Aorta: The aortic root is normal in size and  structure. Venous: The inferior vena cava was not well visualized. IAS/Shunts: No atrial level shunt detected by color flow Doppler. Additional Comments: Normal LV function; moderate LVH.  LEFT VENTRICLE PLAX 2D LVIDd:         4.57 cm LVIDs:         2.84 cm LV PW:         1.44 cm LV IVS:        1.44 cm LVOT diam:     2.10 cm LV SV:         65 ml LV SV Index:   27.69 LVOT Area:     3.46 cm  RIGHT VENTRICLE RV Basal diam:  3.35 cm RV S prime:     11.40 cm/s TAPSE (M-mode): 1.8 cm LEFT ATRIUM             Index       RIGHT ATRIUM           Index LA diam:        3.70 cm 1.64 cm/m  RA Area:     17.50 cm LA Vol (A2C):   42.9 ml 19.02 ml/m RA Volume:   46.00 ml  20.39 ml/m LA Vol (A4C):   48.0 ml 21.28 ml/m LA Biplane Vol: 46.9 ml 20.79 ml/m  AORTIC VALVE LVOT Vmax:   84.98 cm/s LVOT Vmean:  56.540 cm/s LVOT VTI:     0.157 m  AORTA Ao Root diam: 3.20 cm  SHUNTS Systemic VTI:  0.16 m Systemic Diam: 2.10 cm  Kirk Ruths MD Electronically signed by Kirk Ruths MD Signature Date/Time: 12/13/2019/1:55:30 PM    Final    US Abdomen Limited RUQ  Result Date: 12/12/2019 CLINICAL DATA:  Elevated liver enzymes EXAM: ULTRASOUND ABDOMEN LIMITED RIGHT UPPER QUADRANT COMPARISON:  None. FINDINGS: Gallbladder: No gallstones or wall thickening visualized. There is no pericholecystic fluid. No sonographic Murphy sign noted by sonographer. Common bile duct: Diameter: 3 mm. No intrahepatic or extrahepatic biliary duct dilatation. Liver: No focal lesion identified. Liver echogenicity overall is increased. Portal vein is patent on color Doppler imaging with normal direction of blood flow towards the liver. Other: None. IMPRESSION: Diffuse increase in liver echogenicity, a finding indicative of hepatic steatosis. No focal liver lesions are evident; it must be cautioned that the sensitivity of ultrasound for detection of focal liver lesions is somewhat diminished in this circumstance. Study otherwise unremarkable. Electronically Signed   By: Lowella Grip III M.D.   On: 12/12/2019 08:13    Micro Results     Recent Results (from the past 240 hour(s))  Culture, blood (Routine X 2) w Reflex to ID Panel     Status: None (Preliminary result)   Collection Time: 12/12/19  5:57 AM   Specimen: BLOOD  Result Value Ref Range Status   Specimen Description BLOOD RIGHT ANTECUBITAL  Final   Special Requests   Final    BOTTLES DRAWN AEROBIC AND ANAEROBIC Blood Culture adequate volume   Culture   Final    NO GROWTH 3 DAYS Performed at [facility], [address], [place], [place] [address]    Report Status PENDING  Incomplete  Culture, blood (Routine X 2) w Reflex to ID Panel     Status: None (Preliminary result)   Collection Time: 12/12/19  6:02 AM   Specimen: BLOOD  Result Value Ref Range Status   Specimen Description BLOOD  LEFT ANTECUBITAL  Final   Special Requests   Final    BOTTLES  DRAWN AEROBIC AND ANAEROBIC Blood Culture adequate volume   Culture   Final    NO GROWTH 3 DAYS Performed at [facility], [place] [address] [place], [place] [address]    Report Status PENDING  Incomplete    Today   Subjective    Shane Gonzalez today has no headache,no chest abdominal pain,no new weakness tingling or numbness, feels much better wants to go home today.     Objective   Blood pressure 126/84, pulse 80, temperature 97.7 F (36.5 C), temperature source Oral, resp. rate 18, height '5\' 10"'$  (1.778 m), weight 108.9 kg, SpO2 93 %.   Intake/Output Summary (Last 24 hours) at 12/15/2019 1212 Last data filed at 12/15/2019 0600 Gross per 24 hour  Intake 250 ml  Output --  Net 250 ml    Exam Awake Alert, Oriented x 3, No new F.N deficits, Normal affect Roberta.AT,PERRAL Supple Neck,No JVD, No cervical lymphadenopathy appriciated.  Symmetrical Chest wall movement, Good air movement bilaterally, CTAB iRRR,No Gallops,Rubs or new Murmurs, No Parasternal Heave +ve B.Sounds, Abd Soft, Non tender, No organomegaly appriciated, No rebound -guarding or rigidity. No Cyanosis, Clubbing or edema, No new Rash or bruise   Data Review   CBC w Diff:  Lab Results  Component Value Date   WBC 7.5 12/15/2019   HGB 14.1 12/15/2019   HCT 41.2 12/15/2019   PLT 288 12/15/2019   LYMPHOPCT 15 12/15/2019   MONOPCT 7 12/15/2019   EOSPCT 0 12/15/2019   BASOPCT 0 12/15/2019    CMP:  Lab Results  Component Value Date   NA 136 12/15/2019   K 3.9 12/15/2019   CL 100 12/15/2019   CO2 22 12/15/2019   BUN 22 12/15/2019   CREATININE 0.86 12/15/2019   CREATININE 1.03 03/26/2019   PROT 7.1 12/15/2019   ALBUMIN 3.6 12/15/2019   BILITOT 1.0 12/15/2019   ALKPHOS 47 12/15/2019   AST 70 (H) 12/15/2019   ALT 147 (H) 12/15/2019  .   Total Time in preparing paper work, data evaluation and todays exam - 39 minutes  Lala Lund M.D  on 12/15/2019 at 12:12 PM  [facility]   Office  [phone]

## 2019-12-15 NOTE — Progress Notes (Signed)
ANTICOAGULATION CONSULT NOTE - Initial Consult  Pharmacy Consult for Lovenox Indication: VTE prophylaxis  No Known Allergies  Patient Measurements: Height: 5\' 10"  (177.8 cm) Weight: 240 lb (108.9 kg) IBW/kg (Calculated) : 73  Vital Signs: Temp: 97.5 F (36.4 C) (01/11 0451) Temp Source: Oral (01/11 0451) BP: 122/90 (01/11 0451) Pulse Rate: 85 (01/11 0451)  Labs: Recent Labs    12/13/19 0337 12/14/19 0306 12/15/19 0519  HGB 12.9* 13.3 14.1  HCT 37.7* 38.3* 41.2  PLT 209 246 288  CREATININE 0.61 0.68 0.86    Estimated Creatinine Clearance: 111.5 mL/min (by C-G formula based on SCr of 0.86 mg/dL).   Medical History: Past Medical History:  Diagnosis Date  . Bilateral bunions    both feet  . Blood in stool    Hx of  . Diverticulosis    Hx of  . GERD (gastroesophageal reflux disease)   . Hyperlipidemia   . Toe deformity    2nd  . Toenail fungus    rt big toe     Assessment: 64 y/o M admitted with respiratory failure due to COVID-19 PNA. Pharmacy consulted to dose Lovenox for VTE prophylaxis.   BMI 34.4, d-dimer wnl, CBC stable  Goal of Therapy:  Monitor platelets by anticoagulation protocol: Yes   Plan:  -Will continue Lovenox 0.5 mg/kg/day for BMI approaching 35 in COVID-19 patient -Pharmacy will sign off  Thank you for the consult  Napoleon Form 12/15/2019,8:46 AM

## 2019-12-15 NOTE — Progress Notes (Signed)
Patient scheduled for outpatient Remdesivir infusion at 1130AM on Tuesday 1/12.  Please advise them to report to [facility] at [address].  Drive to the security guard and tell them you are here for an infusion. They will direct you to the front entrance where we will come and get you.  For questions call [phone].  Thanks

## 2019-12-16 ENCOUNTER — Ambulatory Visit (HOSPITAL_COMMUNITY)
Admission: RE | Admit: 2019-12-16 | Discharge: 2019-12-16 | Disposition: A | Discharge status: Home / Self Care | Payer: Commercial Managed Care - PPO | Source: Ambulatory Visit | Attending: Pulmonary Disease | Admitting: Pulmonary Disease

## 2019-12-16 DIAGNOSIS — U071 COVID-19: Secondary | ICD-10-CM | POA: Diagnosis not present

## 2019-12-16 MED ORDER — SODIUM CHLORIDE 0.9 % IV SOLN: INTRAVENOUS | Status: DC | PRN

## 2019-12-16 MED ORDER — SODIUM CHLORIDE 0.9 % IV SOLN
INTRAVENOUS | Status: AC
  Filled 2019-12-16: qty 20

## 2019-12-16 MED ORDER — DIPHENHYDRAMINE HCL 50 MG/ML IJ SOLN: 50.0000 mg | Freq: Once | INTRAMUSCULAR | Status: DC | PRN

## 2019-12-16 MED ORDER — EPINEPHRINE 0.3 MG/0.3ML IJ SOAJ: 0.3000 mg | Freq: Once | INTRAMUSCULAR | Status: DC | PRN

## 2019-12-16 MED ORDER — ALBUTEROL SULFATE HFA 108 (90 BASE) MCG/ACT IN AERS: 2.0000 | INHALATION_SPRAY | Freq: Once | RESPIRATORY_TRACT | Status: DC | PRN

## 2019-12-16 MED ORDER — METHYLPREDNISOLONE SODIUM SUCC 125 MG IJ SOLR: 125.0000 mg | Freq: Once | INTRAMUSCULAR | Status: DC | PRN

## 2019-12-16 MED ORDER — SODIUM CHLORIDE 0.9 % IV SOLN
100.0000 mg | Freq: Once | INTRAVENOUS | Status: AC
  Administered 2019-12-16: 100 mg via INTRAVENOUS

## 2019-12-16 MED ORDER — FAMOTIDINE IN NACL 20-0.9 MG/50ML-% IV SOLN: 20.0000 mg | Freq: Once | INTRAVENOUS | Status: DC | PRN

## 2019-12-16 NOTE — Progress Notes (Signed)
  Diagnosis: COVID-19  Physician:  Procedure: Covid Infusion Clinic Med: remdesivir infusion.  Complications: No immediate complications noted.  Discharge: Discharged home   Shane Gonzalez 12/16/2019

## 2019-12-17 LAB — CULTURE, BLOOD (ROUTINE X 2)
Culture: NO GROWTH
Culture: NO GROWTH
Special Requests: ADEQUATE
Special Requests: ADEQUATE

## 2019-12-21 NOTE — Progress Notes (Signed)
Cardiology Office Note:   Date:  12/22/2019  NAME:  Shane Gonzalez    MRN: [id number] DOB:  November 16, 1956   PCP:  Alycia Rossetti, MD  Cardiologist:  Evalina Field, MD   Referring MD: Alycia Rossetti, MD   Chief Complaint  Patient presents with  . New Patient (Initial Visit)   History of Present Illness:   Shane Gonzalez is a 64 y.o. male with a hx of GERD, obesity, hyperlipidemia who is being seen today for the evaluation of atrial fibrillation at the request of Alycia Rossetti, MD.  Recent admission 12/11/2019-12/15/2019 for Covid pneumonia.  Developed atrial fibrillation during that admission.  Due to chads vas score of 0 he was not anticoagulated.  He was discharged with plans to follow-up today.  Recently diagnosed atrial fibrillation in setting of coronavirus.  Seems to be doing fairly well.  CVD risk factors include hyperlipidemia.  Thyroid studies are normal on 12/12/2019.  Reports breathing is improving.  Not back to 100% quite yet.  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  Wants to increase his activity level.  Denies chest pain, shortness of breath, palpitations with current level of activity.  He did report some palpitations or fluttering in his chest when he was first diagnosed with atrial fibrillation.  Rate seem to be well controlled on metoprolol.  No prior history of atrial fibrillation per his report.  No prior history of myocardial infarction or heart failure.  No prior stroke.  No significant thyroid disease.  Does smoke cigars but has not done so recently.  No cigarette use reported.  Moderate alcohol consumption but within reason.  Problem List 1. Atrial fibrillation  -diagnosed 12/2019 in setting of covid PNA 2. Obesity 3. HLD total cholesterol 213, HDL 69, LDL 127, triglycerides 73  Past Medical History: Past Medical History:  Diagnosis Date  . Bilateral bunions    both feet  . Blood in stool    Hx of  . Diverticulosis     Hx of  . GERD (gastroesophageal reflux disease)   . Hyperlipidemia   . Toe deformity    2nd  . Toenail fungus    rt big toe    Past Surgical History: Past Surgical History:  Procedure Laterality Date  . ANTERIOR CRUCIATE LIGAMENT REPAIR    . BUNIONECTOMY  2011  . CLOSED REDUCTION SHOULDER DISLOCATION     left reduced under anesthesia  . hammer toe repair  2011  . REFRACTIVE SURGERY  2009  . SHOULDER SURGERY     right  . TENDON REPAIR  2011  . VASECTOMY    . WISDOM TOOTH EXTRACTION  2011    Current Medications: Current Meds  Medication Sig  . albuterol (VENTOLIN HFA) 108 (90 Base) MCG/ACT inhaler Inhale 1-2 puffs into the lungs every 6 (six) hours as needed for wheezing or shortness of breath.   . benzonatate (TESSALON) 200 MG capsule Take 200 mg by mouth 3 (three) times daily as needed for cough.   . metoprolol tartrate (LOPRESSOR) 100 MG tablet Take 1 tablet (100 mg total) by mouth 2 (two) times daily.  . pantoprazole (PROTONIX) 20 MG tablet Take 1 tablet (20 mg total) by mouth daily.  . [DISCONTINUED] aspirin EC 325 MG EC tablet Take 1 tablet (325 mg total) by mouth daily.  . [DISCONTINUED] metoprolol tartrate (LOPRESSOR) 100 MG tablet Take 1 tablet (100 mg total) by mouth 2 (two) times daily.  Allergies:    Patient has no known allergies.   Social History: Social History   Socioeconomic History  . Marital status: Married    Spouse name: Not on file  . Number of children: 2  . Years of education: Not on file  . Highest education level: Not on file  Occupational History  . Occupation: Press photographer Rep  Tobacco Use  . Smoking status: Current Some Day Smoker    Types: Cigars  . Smokeless tobacco: Never Used  . Tobacco comment: only smokes once yearly  Substance and Sexual Activity  . Alcohol use: Yes    Alcohol/week: 0.0 standard drinks    Comment: weekends 4 beers  . Drug use: No  . Sexual activity: Yes    Birth control/protection: Surgical  Other Topics  Concern  . Not on file  Social History Narrative  . Not on file   Social Determinants of Health   Financial Resource Strain:   . Difficulty of Paying Living Expenses: Not on file  Food Insecurity:   . Worried About Charity fundraiser in the Last Year: Not on file  . Ran Out of Food in the Last Year: Not on file  Transportation Needs:   . Lack of Transportation (Medical): Not on file  . Lack of Transportation (Non-Medical): Not on file  Physical Activity:   . Days of Exercise per Week: Not on file  . Minutes of Exercise per Session: Not on file  Stress:   . Feeling of Stress : Not on file  Social Connections:   . Frequency of Communication with Friends and Family: Not on file  . Frequency of Social Gatherings with Friends and Family: Not on file  . Attends Religious Services: Not on file  . Active Member of Clubs or Organizations: Not on file  . Attends Archivist Meetings: Not on file  . Marital Status: Not on file     Family History: The patient's family history includes Alcohol abuse in his brother and brother; Cancer in his brother, father, mother, paternal aunt, and paternal uncle; Diverticulitis in his brother, brother, and sister; Heart disease in his maternal grandfather; Hypertension in his father; Prostate cancer in his brother; Stroke in his mother; Vision loss in his father and mother.  ROS:   All other ROS reviewed and negative. Pertinent positives noted in the HPI.     EKGs/Labs/Other Studies Reviewed:   The following studies were personally reviewed by me today:  EKG:  EKG is ordered today.  The ekg ordered today demonstrates atrial fibrillation, heart rate 84, no acute ST-T changes, no evidence of prior infarction, and was personally reviewed by me.   TTE 12/13/2019  1. Left ventricular ejection fraction, by visual estimation, is 60 to 65%. The left ventricle has normal function. There is moderately increased left ventricular hypertrophy.  2. Left  ventricular diastolic parameters are indeterminate.  3. The left ventricle has no regional wall motion abnormalities.  4. Global right ventricle has normal systolic function.The right ventricular size is normal.  5. Left atrial size was normal.  6. Right atrial size was normal.  7. The mitral valve is normal in structure. Trivial mitral valve regurgitation. No evidence of mitral stenosis.  8. The tricuspid valve is normal in structure.  9. The aortic valve is tricuspid. Aortic valve regurgitation is not visualized. Mild to moderate aortic valve sclerosis/calcification without any evidence of aortic stenosis. 10. The pulmonic valve was normal in structure. Pulmonic valve regurgitation is not  visualized. 11. Normal LV function; moderate LVH.  Recent Labs: 12/12/2019: TSH 1.768 12/15/2019: ALT 147; B Natriuretic Peptide 273.1; BUN 22; Creatinine, Ser 0.86; Hemoglobin 14.1; Magnesium 2.1; Platelets 288; Potassium 3.9; Sodium 136   Recent Lipid Panel    Component Value Date/Time   CHOL 213 (H) 02/12/2019 1129   TRIG 73 02/12/2019 1129   HDL 69 02/12/2019 1129   CHOLHDL 3.1 02/12/2019 1129   VLDL 12 04/20/2015 1505   LDLCALC 127 (H) 02/12/2019 1129   LDLDIRECT 143.0 07/09/2013 1559    Physical Exam:   VS:  BP 100/70 (BP Location: Left Arm, Patient Position: Sitting, Cuff Size: Large)   Pulse 84   Temp (!) 96.8 F (36 C)   Ht 5\' 10"  (1.778 m)   Wt 238 lb (108 kg)   BMI 34.15 kg/m    Wt Readings from Last 3 Encounters:  12/22/19 238 lb (108 kg)  12/22/19 239 lb (108.4 kg)  12/12/19 240 lb (108.9 kg)    General: Well nourished, well developed, in no acute distress Heart: Atraumatic, normal size  Eyes: PEERLA, EOMI  Neck: Supple, no JVD Endocrine: No thryomegaly Cardiac: Normal S1, S2; RRR; no murmurs, rubs, or gallops Lungs: Clear to auscultation bilaterally, no wheezing, rhonchi or rales  Abd: Soft, nontender, no hepatomegaly  Ext: No edema, pulses 2+ Musculoskeletal: No  deformities, BUE and BLE strength normal and equal Skin: Warm and dry, no rashes   Neuro: Alert and oriented to person, place, time, and situation, CNII-XII grossly intact, no focal deficits  Psych: Normal mood and affect   ASSESSMENT:   Shane Gonzalez is a 64 y.o. male who presents for the following: 1. Persistent atrial fibrillation ([place])   2. Mixed hyperlipidemia     PLAN:   1. Persistent atrial fibrillation ([place]) -Recently diagnosed in setting of covid PNA. Remains in Afib today. Will plan for 3 weeks of xarelto and then DCCV on 2/10. This will allow for 21 days of therapy of AC prior and we can avoid TEE. This is likely the best option to allow him to continue to heal from covid and avoid repeat testing.  -I suspect this is all secondary afib and he will not have issues moving forward -CHADSVASC =0 so we will plan for [facility] 4 weeks after his cardioversion and stop -we will see him back in 6 weeks to make sure he is in NSR  2. Hyperlipidemia  -has hepatic steatosis and elevated LDL -needs statin but not wanting to start -he will see PCP about hepatic steatosis   Disposition: Return in about 6 weeks (around 02/02/2020).  Medication Adjustments/Labs and Tests Ordered: Current medicines are reviewed at length with the patient today.  Concerns regarding medicines are outlined above.  Orders Placed This Encounter  Procedures  . Basic metabolic panel  . CBC  . EKG 12-Lead   Meds ordered this encounter  Medications  . metoprolol tartrate (LOPRESSOR) 100 MG tablet    Sig: Take 1 tablet (100 mg total) by mouth 2 (two) times daily.    Dispense:  60 tablet    Refill:  2  . rivaroxaban (XARELTO) 20 MG TABS tablet    Sig: Take 1 tablet (20 mg total) by mouth daily with supper.    Dispense:  30 tablet    Refill:  2    Patient Instructions  Medication Instructions:  Stop Aspirin Start Xarelto 20 mg daily  *If you need a refill on your cardiac medications before your next appointment,  please call your pharmacy*  Lab Work: BMET, CBC the week of February 8th. If you have labs (blood work) drawn today and your tests are completely normal, you will receive your results only by: [place] Kitchen MyChart Message (if you have MyChart) OR . A paper copy in the mail If you have any lab test that is abnormal or we need to change your treatment, we will call you to review the results.  Testing/Procedures: Your physician has requested that you have a Cardioversion. Electrical Cardioversion uses a jolt of electricity to your heart either through paddles or wired patches attached to your chest. This is a controlled, usually prescheduled, procedure. This procedure is done at the hospital and you are not awake during the procedure. You usually go home the day of the procedure. Please see the instruction sheet given to you today for more information.   Follow-Up: At [facility], you and your health needs are our priority.  As part of our continuing mission to provide you with exceptional heart care, we have created designated Provider Care Teams.  These Care Teams include your primary Cardiologist (physician) and Advanced Practice Providers (APPs -  Physician Assistants and Nurse Practitioners) who all work together to provide you with the care you need, when you need it.  Your next appointment:   6 week(s)  The format for your next appointment:   In Person  Provider:   Eleonore Chiquito, MD  Other Instructions  You are scheduled for a Cardioversion. Cardioversion on February 10th with Dr. Harrell Gave.  Please arrive at the [facility] (Main Entrance A) at [facility]: [address] [place], [place] [address] at 8 am. Starting at 47. (1 hour prior to procedure unless lab work is needed; if lab work is needed arrive 1.5 hours ahead)  DIET: Nothing to eat or drink after midnight except a sip of water with medications (see medication instructions below)  Medication Instructions:   Continue  your anticoagulant: Xarelto You will need to continue your anticoagulant after your procedure until you  are told by your  Provider that it is safe to stop   Labs: If patient is on Coumadin, patient needs pt/INR, CBC, BMET within 3 days (No pt/INR needed for patients taking Xarelto, Eliquis, Pradaxa) For patients receiving anesthesia for TEE and all Cardioversion patients: BMET, CBC within 1 week   You must have a responsible person to drive you home and stay in the waiting area during your procedure. Failure to do so could result in cancellation.  Bring your insurance cards.  *Special Note: Every effort is made to have your procedure done on time. Occasionally there are emergencies that occur at the hospital that may cause delays. Please be patient if a delay does occur.       Signed, Addison Naegeli. Audie Box, [place]  [address] [place], [place] [address] [phone]  12/22/2019 4:25 PM

## 2019-12-22 ENCOUNTER — Ambulatory Visit (INDEPENDENT_AMBULATORY_CARE_PROVIDER_SITE_OTHER): Payer: Commercial Managed Care - PPO | Admitting: Cardiovascular Disease

## 2019-12-22 ENCOUNTER — Encounter: Payer: Self-pay | Admitting: Family Medicine

## 2019-12-22 ENCOUNTER — Encounter: Payer: Self-pay | Admitting: Cardiovascular Disease

## 2019-12-22 ENCOUNTER — Ambulatory Visit (INDEPENDENT_AMBULATORY_CARE_PROVIDER_SITE_OTHER): Payer: Commercial Managed Care - PPO | Admitting: Family Medicine

## 2019-12-22 ENCOUNTER — Other Ambulatory Visit: Payer: Self-pay

## 2019-12-22 VITALS — BP 100/70 | HR 84 | Temp 96.8°F | Ht 70.0 in | Wt 238.0 lb

## 2019-12-22 VITALS — BP 132/68 | HR 74 | Temp 97.9°F | Resp 14 | Ht 69.25 in | Wt 239.0 lb

## 2019-12-22 DIAGNOSIS — Z8616 Personal history of COVID-19: Secondary | ICD-10-CM

## 2019-12-22 DIAGNOSIS — I480 Paroxysmal atrial fibrillation: Secondary | ICD-10-CM

## 2019-12-22 DIAGNOSIS — E782 Mixed hyperlipidemia: Secondary | ICD-10-CM

## 2019-12-22 DIAGNOSIS — K760 Fatty (change of) liver, not elsewhere classified: Secondary | ICD-10-CM | POA: Diagnosis not present

## 2019-12-22 DIAGNOSIS — I4819 Other persistent atrial fibrillation: Secondary | ICD-10-CM

## 2019-12-22 DIAGNOSIS — R945 Abnormal results of liver function studies: Secondary | ICD-10-CM

## 2019-12-22 LAB — COMPREHENSIVE METABOLIC PANEL
AG Ratio: 1.5 (calc) (ref 1.0–2.5)
ALT: 45 U/L (ref 9–46)
AST: 23 U/L (ref 10–35)
Albumin: 4.1 g/dL (ref 3.6–5.1)
Alkaline phosphatase (APISO): 56 U/L (ref 35–144)
BUN: 17 mg/dL (ref 7–25)
CO2: 28 mmol/L (ref 20–32)
Calcium: 9.3 mg/dL (ref 8.6–10.3)
Chloride: 101 mmol/L (ref 98–110)
Creat: 0.99 mg/dL (ref 0.70–1.25)
Globulin: 2.7 g/dL (calc) (ref 1.9–3.7)
Glucose, Bld: 84 mg/dL (ref 65–99)
Potassium: 5.1 mmol/L (ref 3.5–5.3)
Sodium: 137 mmol/L (ref 135–146)
Total Bilirubin: 0.5 mg/dL (ref 0.2–1.2)
Total Protein: 6.8 g/dL (ref 6.1–8.1)

## 2019-12-22 LAB — CBC WITH DIFFERENTIAL/PLATELET
Absolute Monocytes: 422 cells/uL (ref 200–950)
Basophils Absolute: 13 cells/uL (ref 0–200)
Basophils Relative: 0.2 %
Eosinophils Absolute: 82 cells/uL (ref 15–500)
Eosinophils Relative: 1.3 %
HCT: 43.8 % (ref 38.5–50.0)
Hemoglobin: 15.3 g/dL (ref 13.2–17.1)
Lymphs Abs: 1210 cells/uL (ref 850–3900)
MCH: 32.2 pg (ref 27.0–33.0)
MCHC: 34.9 g/dL (ref 32.0–36.0)
MCV: 92.2 fL (ref 80.0–100.0)
MPV: 9.7 fL (ref 7.5–12.5)
Monocytes Relative: 6.7 %
Neutro Abs: 4574 cells/uL (ref 1500–7800)
Neutrophils Relative %: 72.6 %
Platelets: 308 10*3/uL (ref 140–400)
RBC: 4.75 10*6/uL (ref 4.20–5.80)
RDW: 12.7 % (ref 11.0–15.0)
Total Lymphocyte: 19.2 %
WBC: 6.3 10*3/uL (ref 3.8–10.8)

## 2019-12-22 LAB — LIPID PANEL
Cholesterol: 200 mg/dL — ABNORMAL HIGH (ref <200)
HDL: 43 mg/dL (ref >=40)
LDL Cholesterol (Calc): 132 mg/dL (calc) — ABNORMAL HIGH
Non-HDL Cholesterol (Calc): 157 mg/dL (calc) — ABNORMAL HIGH (ref <130)
Total CHOL/HDL Ratio: 4.7 (calc) (ref <5.0)
Triglycerides: 134 mg/dL (ref <150)

## 2019-12-22 MED ORDER — METOPROLOL TARTRATE 100 MG PO TABS: 100.0000 mg | ORAL_TABLET | Freq: Two times a day (BID) | ORAL | 2 refills | Status: DC

## 2019-12-22 MED ORDER — RIVAROXABAN 20 MG PO TABS: 20.0000 mg | ORAL_TABLET | Freq: Every day | ORAL | 2 refills | Status: DC

## 2019-12-22 NOTE — Assessment & Plan Note (Signed)
Hyperlipidemia with fatty liver noted on ultrasound.  If his liver function are back to baseline I would recommend starting him on a statin drug.  Although it is possible that the elevation came from a mix of both the fatty liver and a viral illness

## 2019-12-22 NOTE — Patient Instructions (Signed)
Medication Instructions:  Stop Aspirin Start Xarelto 20 mg daily  *If you need a refill on your cardiac medications before your next appointment, please call your pharmacy*  Lab Work: BMET, CBC the week of February 8th. If you have labs (blood work) drawn today and your tests are completely normal, you will receive your results only by: [place] Kitchen MyChart Message (if you have MyChart) OR . A paper copy in the mail If you have any lab test that is abnormal or we need to change your treatment, we will call you to review the results.  Testing/Procedures: Your physician has requested that you have a Cardioversion. Electrical Cardioversion uses a jolt of electricity to your heart either through paddles or wired patches attached to your chest. This is a controlled, usually prescheduled, procedure. This procedure is done at the hospital and you are not awake during the procedure. You usually go home the day of the procedure. Please see the instruction sheet given to you today for more information.   Follow-Up: At [facility], you and your health needs are our priority.  As part of our continuing mission to provide you with exceptional heart care, we have created designated Provider Care Teams.  These Care Teams include your primary Cardiologist (physician) and Advanced Practice Providers (APPs -  Physician Assistants and Nurse Practitioners) who all work together to provide you with the care you need, when you need it.  Your next appointment:   6 week(s)  The format for your next appointment:   In Person  Provider:   Eleonore Chiquito, MD  Other Instructions  You are scheduled for a Cardioversion. Cardioversion on February 10th with Dr. Harrell Gave.  Please arrive at the [facility] (Main Entrance A) at [facility]: [address] [place], [place] [address] at 8 am. Starting at 70. (1 hour prior to procedure unless lab work is needed; if lab work is needed arrive 1.5 hours ahead)  DIET:  Nothing to eat or drink after midnight except a sip of water with medications (see medication instructions below)  Medication Instructions:   Continue your anticoagulant: Xarelto You will need to continue your anticoagulant after your procedure until you  are told by your  Provider that it is safe to stop   Labs: If patient is on Coumadin, patient needs pt/INR, CBC, BMET within 3 days (No pt/INR needed for patients taking Xarelto, Eliquis, Pradaxa) For patients receiving anesthesia for TEE and all Cardioversion patients: BMET, CBC within 1 week   You must have a responsible person to drive you home and stay in the waiting area during your procedure. Failure to do so could result in cancellation.  Bring your insurance cards.  *Special Note: Every effort is made to have your procedure done on time. Occasionally there are emergencies that occur at the hospital that may cause delays. Please be patient if a delay does occur.

## 2019-12-22 NOTE — Patient Instructions (Addendum)
Get xray done at [facility] in between 8-5pm We will call with lab results  F/U 6 months

## 2019-12-22 NOTE — Progress Notes (Signed)
Subjective:    Patient ID: Shane Gonzalez, male    DOB: 02/04/1956, 64 y.o.   MRN: [id number]  Patient presents for Hospital F/U (COVID- needs labs/CXR)   Pt here for hospital follow up due to COVID pneumonitis infection.   He was given remdesivir.  As well as steroids.  He was hospitalized From January 7 through January 11. During his admission he also went into A. fib with RVR likely due to the stress of the hypoxia.  He was started on IV Cardizem and switched to Lopressor.  He is also placed on aspirin and a PPI for GI protection.  He has an appointment with cardiology this afternoon.  Helps any further palpitations or chest pain. He states that his breathing has been good.  He has not required any cough medicine or the albuterol inhaler.  He would like to return to work tomorrow.    Also noted that he had elevated transaminitis.  He does have underlying hyperlipidemia fatty liver was noted on his ultrasound.  But there is also concerned that maybe the Covid infection itself called elevation in his liver function  Due to have a repeat chest x-ray well his labs  LFT AST 70/ALT 147    Review Of Systems:  GEN- denies fatigue, fever, weight loss,weakness, recent illness HEENT- denies eye drainage, change in vision, nasal discharge, CVS- denies chest pain, palpitations RESP- denies SOB, cough, wheeze ABD- denies N/V, change in stools, abd pain GU- denies dysuria, hematuria, dribbling, incontinence MSK- denies joint pain, muscle aches, injury Neuro- denies headache, dizziness, syncope, seizure activity       Objective:    BP 132/68   Pulse 74   Temp 97.9 F (36.6 C) (Temporal)   Resp 14   Ht 5' 9.25" (1.759 m)   Wt 239 lb (108.4 kg)   SpO2 99%   BMI 35.04 kg/m  GEN- NAD, alert and oriented x3, well-appearing HEENT- PERRL, EOMI, non injected sclera, pink conjunctiva, Neck- Supple, no thyromegaly CVS- irregular rhythm, normal rate, no  murmur RESP-CTAB ABD-NABS,soft,NT,ND EXT- No edema Pulses- Radial, 2+        Assessment & Plan:     Repeat CXR this week  Problem List Items Addressed This Visit      Unprioritized   Abnormal liver function   Relevant Orders   CBC with Differential/Platelet   Comprehensive metabolic panel   Fatty liver    Hyperlipidemia with fatty liver noted on ultrasound.  If his liver function are back to baseline I would recommend starting him on a statin drug.  Although it is possible that the elevation came from a mix of both the fatty liver and a viral illness      Relevant Orders   CBC with Differential/Platelet   Comprehensive metabolic panel   History of COVID-19    He has over his quarantine..  His oxygen saturations are normal his breathing is back to baseline he can return to work tomorrow.      Relevant Orders   CBC with Differential/Platelet   DG Chest 2 View   Hyperlipidemia   Relevant Orders   Lipid panel   PAF (paroxysmal atrial fibrillation) ([place]) - Primary    We will follow-up with cardiology this afternoon.  I am drawing his labs for metabolic panel and CBC.  His blood pressure is controlled.      Relevant Orders   CBC with Differential/Platelet   Comprehensive metabolic panel      Note: This dictation  was prepared with Dragon dictation along with smaller phrase technology. Any transcriptional errors that result from this process are unintentional.

## 2019-12-22 NOTE — Assessment & Plan Note (Signed)
He has over his quarantine..  His oxygen saturations are normal his breathing is back to baseline he can return to work tomorrow.

## 2019-12-22 NOTE — Assessment & Plan Note (Signed)
We will follow-up with cardiology this afternoon.  I am drawing his labs for metabolic panel and CBC.  His blood pressure is controlled.

## 2019-12-24 ENCOUNTER — Ambulatory Visit
Admission: RE | Admit: 2019-12-24 | Discharge: 2019-12-24 | Disposition: A | Discharge status: Home / Self Care | Payer: Commercial Managed Care - PPO | Source: Ambulatory Visit | Attending: Family Medicine | Admitting: Family Medicine

## 2019-12-24 DIAGNOSIS — Z8616 Personal history of COVID-19: Secondary | ICD-10-CM

## 2019-12-29 ENCOUNTER — Other Ambulatory Visit: Payer: Self-pay | Admitting: Family Medicine

## 2019-12-29 MED ORDER — LEVOFLOXACIN 750 MG PO TABS: 750.0000 mg | ORAL_TABLET | Freq: Every day | ORAL | 0 refills | Status: AC

## 2020-01-07 LAB — CBC
Hematocrit: 40.1 % (ref 37.5–51.0)
Hemoglobin: 14 g/dL (ref 13.0–17.7)
MCH: 32.3 pg (ref 26.6–33.0)
MCHC: 34.9 g/dL (ref 31.5–35.7)
MCV: 93 fL (ref 79–97)
Platelets: 226 10*3/uL (ref 150–450)
RBC: 4.33 x10E6/uL (ref 4.14–5.80)
RDW: 13.1 % (ref 11.6–15.4)
WBC: 3.9 10*3/uL (ref 3.4–10.8)

## 2020-01-07 LAB — BASIC METABOLIC PANEL
BUN/Creatinine Ratio: 16 (ref 10–24)
BUN: 16 mg/dL (ref 8–27)
CO2: 23 mmol/L (ref 20–29)
Calcium: 9.8 mg/dL (ref 8.6–10.2)
Chloride: 103 mmol/L (ref 96–106)
Creatinine, Ser: 1 mg/dL (ref 0.76–1.27)
GFR calc Af Amer: 93 mL/min/{1.73_m2} (ref >59)
GFR calc non Af Amer: 81 mL/min/{1.73_m2} (ref >59)
Glucose: 85 mg/dL (ref 65–99)
Potassium: 4.8 mmol/L (ref 3.5–5.2)
Sodium: 141 mmol/L (ref 134–144)

## 2020-01-13 ENCOUNTER — Encounter (HOSPITAL_COMMUNITY): Payer: Self-pay | Admitting: Anesthesiology

## 2020-01-13 NOTE — Anesthesia Preprocedure Evaluation (Deleted)
Anesthesia Evaluation    Reviewed: Allergy & Precautions, Patient's Chart, lab work & pertinent test results  History of Anesthesia Complications Negative for: history of anesthetic complications  Airway        Dental   Pulmonary Current Smoker,           Cardiovascular + dysrhythmias Atrial Fibrillation      Neuro/Psych negative neurological ROS  negative psych ROS   GI/Hepatic Neg liver ROS, GERD  ,  Endo/Other  negative endocrine ROS  Renal/GU negative Renal ROS  negative genitourinary   Musculoskeletal negative musculoskeletal ROS (+)   Abdominal   Peds  Hematology negative hematology ROS (+)   Anesthesia Other Findings Positive for COVID-19 on 12/12/19  Reproductive/Obstetrics                             Anesthesia Physical Anesthesia Plan  ASA: III  Anesthesia Plan: General   Post-op Pain Management:    Induction: Intravenous  PONV Risk Score and Plan: 1 and TIVA and Treatment may vary due to age or medical condition  Airway Management Planned: Mask  Additional Equipment: None  Intra-op Plan:   Post-operative Plan:   Informed Consent:   Plan Discussed with:   Anesthesia Plan Comments:        Anesthesia Quick Evaluation

## 2020-01-13 NOTE — Progress Notes (Signed)
Pre-op call complete, patient states they are not symptomatic with COVID-19 symptoms. Patient states they have not missed any doses of their blood thinner and will take in the morning before procedure. Patient states they have a designated person to drive them home post - procedure. Will arrive by 0800 am.

## 2020-01-14 ENCOUNTER — Ambulatory Visit (HOSPITAL_COMMUNITY)
Admission: RE | Admit: 2020-01-14 | Discharge: 2020-01-14 | Disposition: A | Discharge status: Home / Self Care | Payer: Commercial Managed Care - PPO | Attending: Cardiology | Admitting: Cardiology

## 2020-01-14 ENCOUNTER — Other Ambulatory Visit: Payer: Self-pay

## 2020-01-14 ENCOUNTER — Encounter (HOSPITAL_COMMUNITY)
Admission: RE | Disposition: A | Discharge status: Home / Self Care | Payer: Self-pay | Source: Home / Self Care | Attending: Cardiology

## 2020-01-14 DIAGNOSIS — I4891 Unspecified atrial fibrillation: Secondary | ICD-10-CM | POA: Insufficient documentation

## 2020-01-14 DIAGNOSIS — Z539 Procedure and treatment not carried out, unspecified reason: Secondary | ICD-10-CM | POA: Insufficient documentation

## 2020-01-14 SURGERY — CANCELLED PROCEDURE

## 2020-01-14 NOTE — Progress Notes (Signed)
Patient admitted to endo for cardioversion. Upon checking vitals, patient appears to be in sinus brady. EKG called to confirm, MD Harrell Gave aware.

## 2020-01-19 ENCOUNTER — Telehealth: Payer: Self-pay | Admitting: Cardiovascular Disease

## 2020-01-19 NOTE — Telephone Encounter (Signed)
New Message   Patient is calling because he was to have a cardioversion done and it was cancelled. He would like to know if he needs to continue taking the metoprolol tartrate (LOPRESSOR) 100 MG tablet. Please call to advise.

## 2020-01-19 NOTE — Telephone Encounter (Signed)
Advised patient, verbalized understanding  

## 2020-01-19 NOTE — Telephone Encounter (Signed)
Ok to stop metoprolol. No need to be weaned. Ok for him to keep March 8th appt.  [place] T. Audie Box, [place]  [address] [place], [place] [address] [phone]  2:28 PM

## 2020-01-19 NOTE — Telephone Encounter (Signed)
Spoke with patient and he stated he went to hospital to have cardioversion, he was in sinus rhythm. Cardioversion was cancelled and he stopped his Metoprolol. Per patient he was under the impression Dr Harrell Gave had d/c Metoprolol. Patients sister told him he should have been weaned off Metoprolol. Patient is not sure what he should be doing in regards to his Metoprolol. Also does he need sooner appointment than 3/8. Will forward to Dr Audie Box for review

## 2020-01-20 ENCOUNTER — Ambulatory Visit: Payer: Commercial Managed Care - PPO | Admitting: Cardiovascular Disease

## 2020-01-22 NOTE — Progress Notes (Signed)
Sure. Shane Gonzalez

## 2020-02-04 ENCOUNTER — Encounter: Payer: Self-pay | Admitting: Cardiovascular Disease

## 2020-02-04 NOTE — Telephone Encounter (Signed)
error 

## 2020-02-05 ENCOUNTER — Other Ambulatory Visit: Payer: Self-pay | Admitting: Cardiovascular Disease

## 2020-02-08 NOTE — Progress Notes (Signed)
Cardiology Office Note:   Date:  02/09/2020  NAME:  Shane Gonzalez    MRN: [id number] DOB:  [birth year]   PCP:  Alycia Rossetti, MD  Cardiologist:  Evalina Field, MD   Referring MD: Alycia Rossetti, MD   Chief Complaint  Patient presents with  . Atrial Fibrillation   History of Present Illness:   Shane Gonzalez is a 64 y.o. male with a hx of obesity and covid 19 who presents for follow up of atrial fibrillation. Diagnosed in January in setting of covid 19 PNA. Was sent for TEE/DCCV but was in NSR.  EKG today shows normal sinus rhythm.  He reports no palpitations.  No further recurrence per his knowledge.  He reports he is doing quite well and is returned back to work without any limitations.  He denies any chest pain, shortness of breath or palpitations with exertion.  Of note, his procedure was canceled.  He apparently was still charged for the cardioversion.  He is undergoing dispute with hospital about the payment for this procedure.  Blood pressure today well controlled.  Most recent LDL cholesterol 132.  Problem List 1. Atrial fibrillation  -diagnosed 12/2019 in setting of covid PNA 2. Obesity 3. HLD total cholesterol 213, HDL 69, LDL 127, triglycerides 73  Past Medical History: Past Medical History:  Diagnosis Date  . Bilateral bunions    both feet  . Blood in stool    Hx of  . Diverticulosis    Hx of  . GERD (gastroesophageal reflux disease)   . Hyperlipidemia   . Toe deformity    2nd  . Toenail fungus    rt big toe    Past Surgical History: Past Surgical History:  Procedure Laterality Date  . ANTERIOR CRUCIATE LIGAMENT REPAIR    . BUNIONECTOMY  2011  . CLOSED REDUCTION SHOULDER DISLOCATION     left reduced under anesthesia  . hammer toe repair  2011  . REFRACTIVE SURGERY  2009  . SHOULDER SURGERY     right  . TENDON REPAIR  2011  . VASECTOMY    . WISDOM TOOTH EXTRACTION  2011    Current Medications: No outpatient medications have been marked as taking for  the 02/09/20 encounter (Office Visit) with Geralynn Rile, MD.     Allergies:    Patient has no known allergies.   Social History: Social History   Socioeconomic History  . Marital status: Married    Spouse name: Not on file  . Number of children: 2  . Years of education: Not on file  . Highest education level: Not on file  Occupational History  . Occupation: Press photographer Rep  Tobacco Use  . Smoking status: Current Some Day Smoker    Types: Cigars  . Smokeless tobacco: Never Used  . Tobacco comment: only smokes once yearly  Substance and Sexual Activity  . Alcohol use: Yes    Alcohol/week: 0.0 standard drinks    Comment: weekends 4 beers  . Drug use: No  . Sexual activity: Yes    Birth control/protection: Surgical  Other Topics Concern  . Not on file  Social History Narrative  . Not on file   Social Determinants of Health   Financial Resource Strain:   . Difficulty of Paying Living Expenses: Not on file  Food Insecurity:   . Worried About Charity fundraiser in the Last Year: Not on file  . Ran Out of Food in the Last Year: Not on file  Transportation Needs:   . Film/video editor (Medical): Not on file  . Lack of Transportation (Non-Medical): Not on file  Physical Activity:   . Days of Exercise per Week: Not on file  . Minutes of Exercise per Session: Not on file  Stress:   . Feeling of Stress : Not on file  Social Connections:   . Frequency of Communication with Friends and Family: Not on file  . Frequency of Social Gatherings with Friends and Family: Not on file  . Attends Religious Services: Not on file  . Active Member of Clubs or Organizations: Not on file  . Attends Archivist Meetings: Not on file  . Marital Status: Not on file     Family History: The patient's family history includes Alcohol abuse in his brother and brother; Cancer in his brother, father, mother, paternal aunt, and paternal uncle; Diverticulitis in his brother, brother,  and sister; Heart disease in his maternal grandfather; Hypertension in his father; Prostate cancer in his brother; Stroke in his mother; Vision loss in his father and mother.  ROS:   All other ROS reviewed and negative. Pertinent positives noted in the HPI.     EKGs/Labs/Other Studies Reviewed:   The following studies were personally reviewed by me today:  EKG:  EKG is ordered today.  The ekg ordered today demonstrates normal sinus rhythm, heart rate 84, rare PACs noted, and was personally reviewed by me.   Recent Labs: 12/12/2019: TSH 1.768 12/15/2019: B Natriuretic Peptide 273.1; Magnesium 2.1 12/22/2019: ALT 45 01/07/2020: BUN 16; Creatinine, Ser 1.00; Hemoglobin 14.0; Platelets 226; Potassium 4.8; Sodium 141   Recent Lipid Panel    Component Value Date/Time   CHOL 200 (H) 12/22/2019 1220   TRIG 134 12/22/2019 1220   HDL 43 12/22/2019 1220   CHOLHDL 4.7 12/22/2019 1220   VLDL 12 04/20/2015 1505   LDLCALC 132 (H) 12/22/2019 1220   LDLDIRECT 143.0 07/09/2013 1559    Physical Exam:   VS:  BP 138/82   Pulse 84   Ht 5\' 10"  (1.778 m)   Wt 250 lb 3.2 oz (113.5 kg)   SpO2 96%   BMI 35.90 kg/m    Wt Readings from Last 3 Encounters:  02/09/20 250 lb 3.2 oz (113.5 kg)  12/22/19 238 lb (108 kg)  12/22/19 239 lb (108.4 kg)    General: Well nourished, well developed, in no acute distress Heart: Atraumatic, normal size  Eyes: PEERLA, EOMI  Neck: Supple, no JVD Endocrine: No thryomegaly Cardiac: Normal S1, S2; RRR; no murmurs, rubs, or gallops Lungs: Clear to auscultation bilaterally, no wheezing, rhonchi or rales  Abd: Soft, nontender, no hepatomegaly  Ext: No edema, pulses 2+ Musculoskeletal: No deformities, BUE and BLE strength normal and equal Skin: Warm and dry, no rashes   Neuro: Alert and oriented to person, place, time, and situation, CNII-XII grossly intact, no focal deficits  Psych: Normal mood and affect   ASSESSMENT:   Shane Gonzalez is a 64 y.o. male who presents for  the following: 1. Paroxysmal atrial fibrillation (HCC)     PLAN:   1. Paroxysmal atrial fibrillation (HCC) -Was evaluated last month and noted to be in atrial fibrillation after COVID-19 pneumonia.  We set him up for a TEE/cardioversion but he spontaneously converted back to normal sinus rhythm.  He did not undergo a cardioversion procedure, and this was canceled. -He can hold his Xarelto moving forward as well as metoprolol.  No strong indication for his medications. -CHADSVASC=0. -He  will see [place] on an as-needed basis for atrial fibrillation. -I have reached out to billing to help with his assistance.  He should not have been charged for this procedure as he did not undergo a cardioversion.   Disposition: Return if symptoms worsen or fail to improve.  Medication Adjustments/Labs and Tests Ordered: Current medicines are reviewed at length with the patient today.  Concerns regarding medicines are outlined above.  Orders Placed This Encounter  Procedures  . EKG 12-Lead   No orders of the defined types were placed in this encounter.   Patient Instructions  Medication Instructions:  The current medical regimen is effective;  continue present plan and medications.  *If you need a refill on your cardiac medications before your next appointment, please call your pharmacy*   Follow-Up: At [facility], you and your health needs are our priority.  As part of our continuing mission to provide you with exceptional heart care, we have created designated Provider Care Teams.  These Care Teams include your primary Cardiologist (physician) and Advanced Practice Providers (APPs -  Physician Assistants and Nurse Practitioners) who all work together to provide you with the care you need, when you need it.  We recommend signing up for the patient portal called "MyChart".  Sign up information is provided on this After Visit Summary.  MyChart is used to connect with patients for Virtual Visits  (Telemedicine).  Patients are able to view lab/test results, encounter notes, upcoming appointments, etc.  Non-urgent messages can be sent to your provider as well.   To learn more about what you can do with MyChart, go to [email or web address].    Your next appointment:   As needed  The format for your next appointment:   In Person  Provider:   Eleonore Chiquito, MD        Time Spent with Patient: I have spent a total of 20 minutes with patient reviewing hospital notes, telemetry, EKGs, labs and examining the patient as well as establishing an assessment and plan that was discussed with the patient.  > 50% of time was spent in direct patient care.  Signed, Addison Naegeli. Audie Box, [place]  [address], [place], [place] [address] [phone]  02/09/2020 4:56 PM

## 2020-02-09 ENCOUNTER — Other Ambulatory Visit: Payer: Self-pay

## 2020-02-09 ENCOUNTER — Encounter: Payer: Self-pay | Admitting: Cardiovascular Disease

## 2020-02-09 ENCOUNTER — Ambulatory Visit (INDEPENDENT_AMBULATORY_CARE_PROVIDER_SITE_OTHER): Payer: Commercial Managed Care - PPO | Admitting: Cardiovascular Disease

## 2020-02-09 VITALS — BP 138/82 | HR 84 | Ht 70.0 in | Wt 250.2 lb

## 2020-02-09 DIAGNOSIS — I480 Paroxysmal atrial fibrillation: Secondary | ICD-10-CM | POA: Diagnosis not present

## 2020-02-09 NOTE — Patient Instructions (Signed)
Medication Instructions:  The current medical regimen is effective;  continue present plan and medications.  *If you need a refill on your cardiac medications before your next appointment, please call your pharmacy*    Follow-Up: At [facility], you and your health needs are our priority.  As part of our continuing mission to provide you with exceptional heart care, we have created designated Provider Care Teams.  These Care Teams include your primary Cardiologist (physician) and Advanced Practice Providers (APPs -  Physician Assistants and Nurse Practitioners) who all work together to provide you with the care you need, when you need it.  We recommend signing up for the patient portal called "MyChart".  Sign up information is provided on this After Visit Summary.  MyChart is used to connect with patients for Virtual Visits (Telemedicine).  Patients are able to view lab/test results, encounter notes, upcoming appointments, etc.  Non-urgent messages can be sent to your provider as well.   To learn more about what you can do with MyChart, go to [email or web address].    Your next appointment:   As needed  The format for your next appointment:   In Person  Provider:   Lyncourt O'Neal, MD      

## 2020-02-17 ENCOUNTER — Other Ambulatory Visit: Payer: Self-pay

## 2020-06-21 ENCOUNTER — Ambulatory Visit: Payer: Commercial Managed Care - PPO | Admitting: Family Medicine

## 2020-06-28 ENCOUNTER — Encounter: Payer: Self-pay | Admitting: Family Medicine

## 2020-06-28 ENCOUNTER — Ambulatory Visit (INDEPENDENT_AMBULATORY_CARE_PROVIDER_SITE_OTHER): Payer: Commercial Managed Care - PPO | Admitting: Family Medicine

## 2020-06-28 ENCOUNTER — Other Ambulatory Visit: Payer: Self-pay

## 2020-06-28 VITALS — BP 130/70 | HR 72 | Temp 98.2°F | Resp 14 | Ht 70.0 in | Wt 254.0 lb

## 2020-06-28 DIAGNOSIS — E785 Hyperlipidemia, unspecified: Secondary | ICD-10-CM

## 2020-06-28 DIAGNOSIS — K644 Residual hemorrhoidal skin tags: Secondary | ICD-10-CM | POA: Diagnosis not present

## 2020-06-28 DIAGNOSIS — E669 Obesity, unspecified: Secondary | ICD-10-CM | POA: Insufficient documentation

## 2020-06-28 DIAGNOSIS — K760 Fatty (change of) liver, not elsewhere classified: Secondary | ICD-10-CM

## 2020-06-28 DIAGNOSIS — R2242 Localized swelling, mass and lump, left lower limb: Secondary | ICD-10-CM

## 2020-06-28 MED ORDER — HYDROCORTISONE ACETATE 25 MG RE SUPP: 25.0000 mg | Freq: Two times a day (BID) | RECTAL | 1 refills | Status: DC | PRN

## 2020-06-28 NOTE — Patient Instructions (Addendum)
Return for fasting labs in the morning  Referral to podiatry  Epson salt soak / sitz bath  Anusol twice a day  I will obtain records  F/U 4 months for Physical

## 2020-06-28 NOTE — Assessment & Plan Note (Signed)
Return for fasting labs in AM Discussed dietary changes  Noted in setting of fatty liver and obesity

## 2020-06-28 NOTE — Assessment & Plan Note (Signed)
He has started walking for exercise

## 2020-06-28 NOTE — Progress Notes (Signed)
Subjective:    Patient ID: Shane Gonzalez, male    DOB: 03/07/1956, 64 y.o.   MRN: [id number]  Patient presents for Follow-up (is not fasting) and R Shoulder Rotator Cuff Tear Pt here to f/u chronic medical problems   Fatty liver/hyperlipidemia- due for repeat lipids, LFT  PAF during setting of COVID-19, seen by cardiology he was taken off of his metoprolol as well as his Xarelto.  He converted on his home.  This was thought to be secondary to acute illness so they are just monitoring symptoms for now.  Last visit he sustained an injury on the job.  He has a rotator cuff tear he had MRI performed at [facility] and is Worker's Comp. please set him up with orthopedist Dr. Sylvester Harder at [facility]. They are planning to get him involved in physical therapy to help with strengthening.  Surgery was discussed but at this time best option to do physical therapy I did review once the [facility] urgent care notes which is where he presented first and had x-ray which did not show any fracture after the fall.  He also has a bulge in his rectal area that is been present for the past couple weeks.  No pain initially had some hard stools but now the stools are loose.  He has not noted any bleeding.  He has been using Tucks pads but it is recommended that he try Anusol.  He has a nodule/growth on his left great toe occurred after he had a large ingrown nail which he did trying to take out.  It is very hard.  Has been present for the past few months    Review Of Systems:  GEN- denies fatigue, fever, weight loss,weakness, recent illness HEENT- denies eye drainage, change in vision, nasal discharge, CVS- denies chest pain, palpitations RESP- denies SOB, cough, wheeze ABD- denies N/V, change in stools, abd pain GU- denies dysuria, hematuria, dribbling, incontinence MSK- denies joint pain, muscle aches, injury Neuro- denies headache, dizziness, syncope, seizure activity        Objective:    BP (!) 130/70   Pulse 72   Temp 98.2 F (36.8 C) (Temporal)   Resp 14   Ht 5\' 10"  (1.778 m)   Wt (!) 254 lb (115.2 kg)   SpO2 98%   BMI 36.45 kg/m  GEN- NAD, alert and oriented x3 HEENT- PERRL, EOMI, non injected sclera, pink conjunctiva, MMM, oropharynx clear Neck- Supple, CVS- RRR, no murmur RESP-CTAB ABD-NABS,soft,NT,ND Rectum large external hemorrhoid nontender Skin left great toe small flesh tone growth on the medial nail bed nontender no fluctuance no erythema MSK-  decreased range of motion of right upper extremity compared to left EXT- No edema Pulses- Radial, DP- 2+        Assessment & Plan:     obtain records related to his rotator cuff injury   Problem List Items Addressed This Visit      Unprioritized   Class 2 obesity    He has started walking for exercise      Relevant Orders   Hemoglobin A1c   Fatty liver   Relevant Orders   Comprehensive metabolic panel   Lipid panel   Hyperlipidemia    Return for fasting labs in AM Discussed dietary changes  Noted in setting of fatty liver and obesity        Other Visit Diagnoses    External hemorrhoid    -  Primary   sitz bath with  epson salt, anusol cream to be applied, keep stools soft, can add stool softner if needed ,hemorroid non tender on exam   Skin nodule of toe of left foot       growth related tomedial nailbed and previous ingrown, hard nodular lesion, referral to podiatry   Relevant Orders   Ambulatory referral to Podiatry      Note: This dictation was prepared with Dragon dictation along with smaller phrase technology. Any transcriptional errors that result from this process are unintentional.

## 2020-06-29 ENCOUNTER — Other Ambulatory Visit: Payer: Commercial Managed Care - PPO

## 2020-06-29 DIAGNOSIS — E669 Obesity, unspecified: Secondary | ICD-10-CM

## 2020-06-29 DIAGNOSIS — K760 Fatty (change of) liver, not elsewhere classified: Secondary | ICD-10-CM

## 2020-06-30 LAB — HEMOGLOBIN A1C
Hgb A1c MFr Bld: 5.3 % of total Hgb (ref <5.7)
Mean Plasma Glucose: 105 (calc)
eAG (mmol/L): 5.8 (calc)

## 2020-06-30 LAB — COMPREHENSIVE METABOLIC PANEL
AG Ratio: 1.8 (calc) (ref 1.0–2.5)
ALT: 29 U/L (ref 9–46)
AST: 22 U/L (ref 10–35)
Albumin: 4.4 g/dL (ref 3.6–5.1)
Alkaline phosphatase (APISO): 58 U/L (ref 35–144)
BUN: 16 mg/dL (ref 7–25)
CO2: 26 mmol/L (ref 20–32)
Calcium: 9.5 mg/dL (ref 8.6–10.3)
Chloride: 104 mmol/L (ref 98–110)
Creat: 1.09 mg/dL (ref 0.70–1.25)
Globulin: 2.4 g/dL (calc) (ref 1.9–3.7)
Glucose, Bld: 92 mg/dL (ref 65–99)
Potassium: 5.2 mmol/L (ref 3.5–5.3)
Sodium: 138 mmol/L (ref 135–146)
Total Bilirubin: 0.5 mg/dL (ref 0.2–1.2)
Total Protein: 6.8 g/dL (ref 6.1–8.1)

## 2020-06-30 LAB — LIPID PANEL
Cholesterol: 220 mg/dL — ABNORMAL HIGH (ref <200)
HDL: 59 mg/dL (ref >=40)
LDL Cholesterol (Calc): 140 mg/dL (calc) — ABNORMAL HIGH
Non-HDL Cholesterol (Calc): 161 mg/dL (calc) — ABNORMAL HIGH (ref <130)
Total CHOL/HDL Ratio: 3.7 (calc) (ref <5.0)
Triglycerides: 100 mg/dL (ref <150)

## 2020-07-01 ENCOUNTER — Other Ambulatory Visit: Payer: Self-pay | Admitting: *Deleted

## 2020-07-01 MED ORDER — ATORVASTATIN CALCIUM 20 MG PO TABS: 20.0000 mg | ORAL_TABLET | Freq: Every day | ORAL | 3 refills | Status: DC

## 2020-07-28 ENCOUNTER — Encounter: Payer: Self-pay | Admitting: Podiatry

## 2020-07-28 ENCOUNTER — Ambulatory Visit (INDEPENDENT_AMBULATORY_CARE_PROVIDER_SITE_OTHER): Payer: Commercial Managed Care - PPO | Admitting: Podiatry

## 2020-07-28 DIAGNOSIS — M799 Soft tissue disorder, unspecified: Secondary | ICD-10-CM | POA: Diagnosis not present

## 2020-07-28 NOTE — Progress Notes (Signed)
He presents today chief complaint of a skin nodule on the lateral border of the hallux left.  He states been present for the past 8 to 12 months he really does not know what caused it possibly shoes rubbing possibly infections but is been present for quite some time.  ROS: Denies fever chills nausea vomiting muscle aches pains calf pain back pain chest pain shortness of breath.  Objective: Vital signs are stable he is alert and oriented x3.  Pulses are palpable capillary fill time digits 1 through 5 is immediate.  Neurologic sensorium is intact per Semmes Weinstein monofilament deep tendon reflexes are intact and muscle strength is normal and symmetrical 5 out of 5 dorsiflexors plantar flexors inverters and everters.  Cutaneous evaluation demonstrates supple well-hydrated cutis no open lesions or wounds.  He does have an acral coat on or what appears to be a skin tag to the distal lateral aspect of the hallux left.  It is nontender on palpation of itself but it is pedunculated.  This does not not appear to be a granuloma it is epithelialized.  Assessment: Skin tag acrochordon hallux left.  Plan: After local anesthetic consisting of a 50-50 mixture of Marcaine plain and lidocaine with epinephrine was injected at the base of the hallux and the area was prepped and draped in his normal sterile fashion.  A double elliptical incision was made measuring about 6 mm in total length around the lateral border and the lesion.  This was carried deep to the subcutaneous tissue.  The specimen was placed in a specimen jar to be sent for pathologic evaluation and I did go ahead and rinse out the area today and closed with 2 simple 4-0 Prolene sutures.  I then placed a dry sterile compressive dressing on requesting him to leave this on for the next 2 days and then he can start washing it.  At that point he can soak in Epson salt and warm water apply a small amount of Neosporin and cover with a Band-Aid.  I recommended that  he wear open toed shoes until sutures are removed and I will follow-up with him in 1 week

## 2020-08-04 ENCOUNTER — Telehealth: Payer: Self-pay | Admitting: *Deleted

## 2020-08-04 ENCOUNTER — Encounter: Payer: Self-pay | Admitting: Podiatry

## 2020-08-04 ENCOUNTER — Other Ambulatory Visit: Payer: Self-pay

## 2020-08-04 ENCOUNTER — Ambulatory Visit (INDEPENDENT_AMBULATORY_CARE_PROVIDER_SITE_OTHER): Payer: Commercial Managed Care - PPO | Admitting: Podiatry

## 2020-08-04 DIAGNOSIS — M799 Soft tissue disorder, unspecified: Secondary | ICD-10-CM

## 2020-08-04 NOTE — Telephone Encounter (Signed)
I'm calling to let you know that Dr. Milinda Pointer said your lab results came back and it was benign, not cancer.  He said it's a skin tag.  "Great, good news, I'm glad to hear that.  Thanks for letting me know."

## 2020-08-04 NOTE — Progress Notes (Signed)
He presents today for follow-up of his excision soft tissue lesion fibular border hallux left.  He denies fever chills nausea vomiting muscle aches and pains.  Objective: Vital signs are stable he is alert oriented times 3 sutures were removed today because margins were well coapted pathology results did demonstrate a acrochordon.  Assessment: Well-healing excision soft tissue lesion.  Plan: Follow-up with me on an as-needed basis.

## 2020-11-02 ENCOUNTER — Ambulatory Visit (INDEPENDENT_AMBULATORY_CARE_PROVIDER_SITE_OTHER): Payer: Commercial Managed Care - PPO | Admitting: Family Medicine

## 2020-11-02 ENCOUNTER — Encounter: Payer: Self-pay | Admitting: Family Medicine

## 2020-11-02 ENCOUNTER — Other Ambulatory Visit: Payer: Self-pay

## 2020-11-02 VITALS — BP 132/76 | HR 80 | Temp 98.4°F | Resp 14 | Ht 70.0 in | Wt 256.0 lb

## 2020-11-02 DIAGNOSIS — Z0000 Encounter for general adult medical examination without abnormal findings: Secondary | ICD-10-CM

## 2020-11-02 DIAGNOSIS — Z125 Encounter for screening for malignant neoplasm of prostate: Secondary | ICD-10-CM

## 2020-11-02 DIAGNOSIS — Z8042 Family history of malignant neoplasm of prostate: Secondary | ICD-10-CM

## 2020-11-02 DIAGNOSIS — Z0001 Encounter for general adult medical examination with abnormal findings: Secondary | ICD-10-CM | POA: Diagnosis not present

## 2020-11-02 DIAGNOSIS — Z23 Encounter for immunization: Secondary | ICD-10-CM | POA: Diagnosis not present

## 2020-11-02 DIAGNOSIS — K760 Fatty (change of) liver, not elsewhere classified: Secondary | ICD-10-CM | POA: Diagnosis not present

## 2020-11-02 DIAGNOSIS — E669 Obesity, unspecified: Secondary | ICD-10-CM | POA: Diagnosis not present

## 2020-11-02 DIAGNOSIS — E66812 Obesity, class 2: Secondary | ICD-10-CM

## 2020-11-02 NOTE — Assessment & Plan Note (Signed)
Discussed dietary changes.

## 2020-11-02 NOTE — Progress Notes (Signed)
   Subjective:    Patient ID: Shane Gonzalez, male    DOB: 12-02-1956, 64 y.o.   MRN: [id number]  Patient presents for Annual Exam (is fasting)  Pt here for CPE   Obesity- He was been trying to work on dietary changes    Follows with dentist- no major concerns Eye doctor UTD- he was    Rosacea- uses metrogel as needed  Fatty liver/hyperlipidemia- last visit in July LDL  140 ,TC 220 which was an increase ,he was started onlipitor 20mg  at bedtime  A1C was normal   PAF after COVID-  doing well no further meds needed   Immunizations- due for flu shot  COVID shot    Colonoscopy UTD Due for PSA screening   Dr.Kevin Supple- will have right shoulder surgery - torn rotator cuff and biceps- Next Friday      Review Of Systems:  GEN- denies fatigue, fever, weight loss,weakness, recent illness HEENT- denies eye drainage, change in vision, nasal discharge, CVS- denies chest pain, palpitations RESP- denies SOB, cough, wheeze ABD- denies N/V, change in stools, abd pain GU- denies dysuria, hematuria, dribbling, incontinence MSK- + joint pain, muscle aches, injury Neuro- denies headache, dizziness, syncope, seizure activity       Objective:    BP 132/76   Pulse 80   Temp 98.4 F (36.9 C) (Temporal)   Resp 14   Ht 5\' 10"  (1.778 m)   Wt 256 lb (116.1 kg)   SpO2 98%   BMI 36.73 kg/m  GEN- NAD, alert and oriented x3 HEENT- PERRL, EOMI, non injected sclera, pink conjunctiva, MMM, oropharynx clear Neck- Supple, no thyromegaly CVS- RRR, no murmur RESP-CTAB ABD-NABS,soft,NT,ND EXT- No edema Pulses- Radial, DP- 2+        Assessment & Plan:      Problem List Items Addressed This Visit      Unprioritized   Class 2 obesity    Discussed dietary changes       Family history of prostate cancer   Relevant Orders   PSA   Fatty liver    He wants to work on dietary changes before starting statin drug We discussed increasing fiber in diet, more veggies Reduce carbs/fried  foods       Routine general medical examination at a health care facility - Primary    CPE done, fasting labs, flu shot given      Relevant Orders   CBC with Differential/Platelet   Comprehensive metabolic panel    Other Visit Diagnoses    Prostate cancer screening       PSA screening, has family history in brother    Relevant Orders   PSA   Need for immunization against influenza       Relevant Orders   Flu Vaccine QUAD 36+ mos IM (Completed)      Note: This dictation was prepared with Dragon dictation along with smaller phrase technology. Any transcriptional errors that result from this process are unintentional.

## 2020-11-02 NOTE — Assessment & Plan Note (Signed)
He wants to work on dietary changes before starting statin drug We discussed increasing fiber in diet, more veggies Reduce carbs/fried foods

## 2020-11-02 NOTE — Assessment & Plan Note (Signed)
CPE done, fasting labs, flu shot given

## 2020-11-02 NOTE — Patient Instructions (Signed)
Increase your fiber in diet More fruits and veggies  Reduce fried foods, and breads- lower carbs in general  We will call with lab results F/U 3 months for lab visit for Cholesterol

## 2020-11-03 LAB — CBC WITH DIFFERENTIAL/PLATELET
Absolute Monocytes: 281 cells/uL (ref 200–950)
Basophils Absolute: 9 cells/uL (ref 0–200)
Basophils Relative: 0.2 %
Eosinophils Absolute: 92 cells/uL (ref 15–500)
Eosinophils Relative: 2 %
HCT: 43.6 % (ref 38.5–50.0)
Hemoglobin: 15.4 g/dL (ref 13.2–17.1)
Lymphs Abs: 1072 cells/uL (ref 850–3900)
MCH: 32.9 pg (ref 27.0–33.0)
MCHC: 35.3 g/dL (ref 32.0–36.0)
MCV: 93.2 fL (ref 80.0–100.0)
MPV: 10 fL (ref 7.5–12.5)
Monocytes Relative: 6.1 %
Neutro Abs: 3146 cells/uL (ref 1500–7800)
Neutrophils Relative %: 68.4 %
Platelets: 219 10*3/uL (ref 140–400)
RBC: 4.68 10*6/uL (ref 4.20–5.80)
RDW: 12.7 % (ref 11.0–15.0)
Total Lymphocyte: 23.3 %
WBC: 4.6 10*3/uL (ref 3.8–10.8)

## 2020-11-03 LAB — COMPREHENSIVE METABOLIC PANEL
AG Ratio: 1.8 (calc) (ref 1.0–2.5)
ALT: 38 U/L (ref 9–46)
AST: 31 U/L (ref 10–35)
Albumin: 4.6 g/dL (ref 3.6–5.1)
Alkaline phosphatase (APISO): 61 U/L (ref 35–144)
BUN: 20 mg/dL (ref 7–25)
CO2: 25 mmol/L (ref 20–32)
Calcium: 9.9 mg/dL (ref 8.6–10.3)
Chloride: 102 mmol/L (ref 98–110)
Creat: 1 mg/dL (ref 0.70–1.25)
Globulin: 2.6 g/dL (calc) (ref 1.9–3.7)
Glucose, Bld: 87 mg/dL (ref 65–99)
Potassium: 4.6 mmol/L (ref 3.5–5.3)
Sodium: 138 mmol/L (ref 135–146)
Total Bilirubin: 0.7 mg/dL (ref 0.2–1.2)
Total Protein: 7.2 g/dL (ref 6.1–8.1)

## 2020-11-03 LAB — PSA: PSA: 0.32 ng/mL (ref <=4.0)

## 2020-11-04 ENCOUNTER — Encounter: Payer: Self-pay | Admitting: *Deleted

## 2020-11-06 IMAGING — DX DG CHEST 2V
2 series · 2 of 2 positions shown · non-contrast
Comparison: Chest x-ray 12/11/2019.  Chest CT 12/12/2019

CLINICAL DATA: [id number] pneumonia

EXAM:
CHEST - 2 VIEW

[dg chest 2 view (1 of 2)]
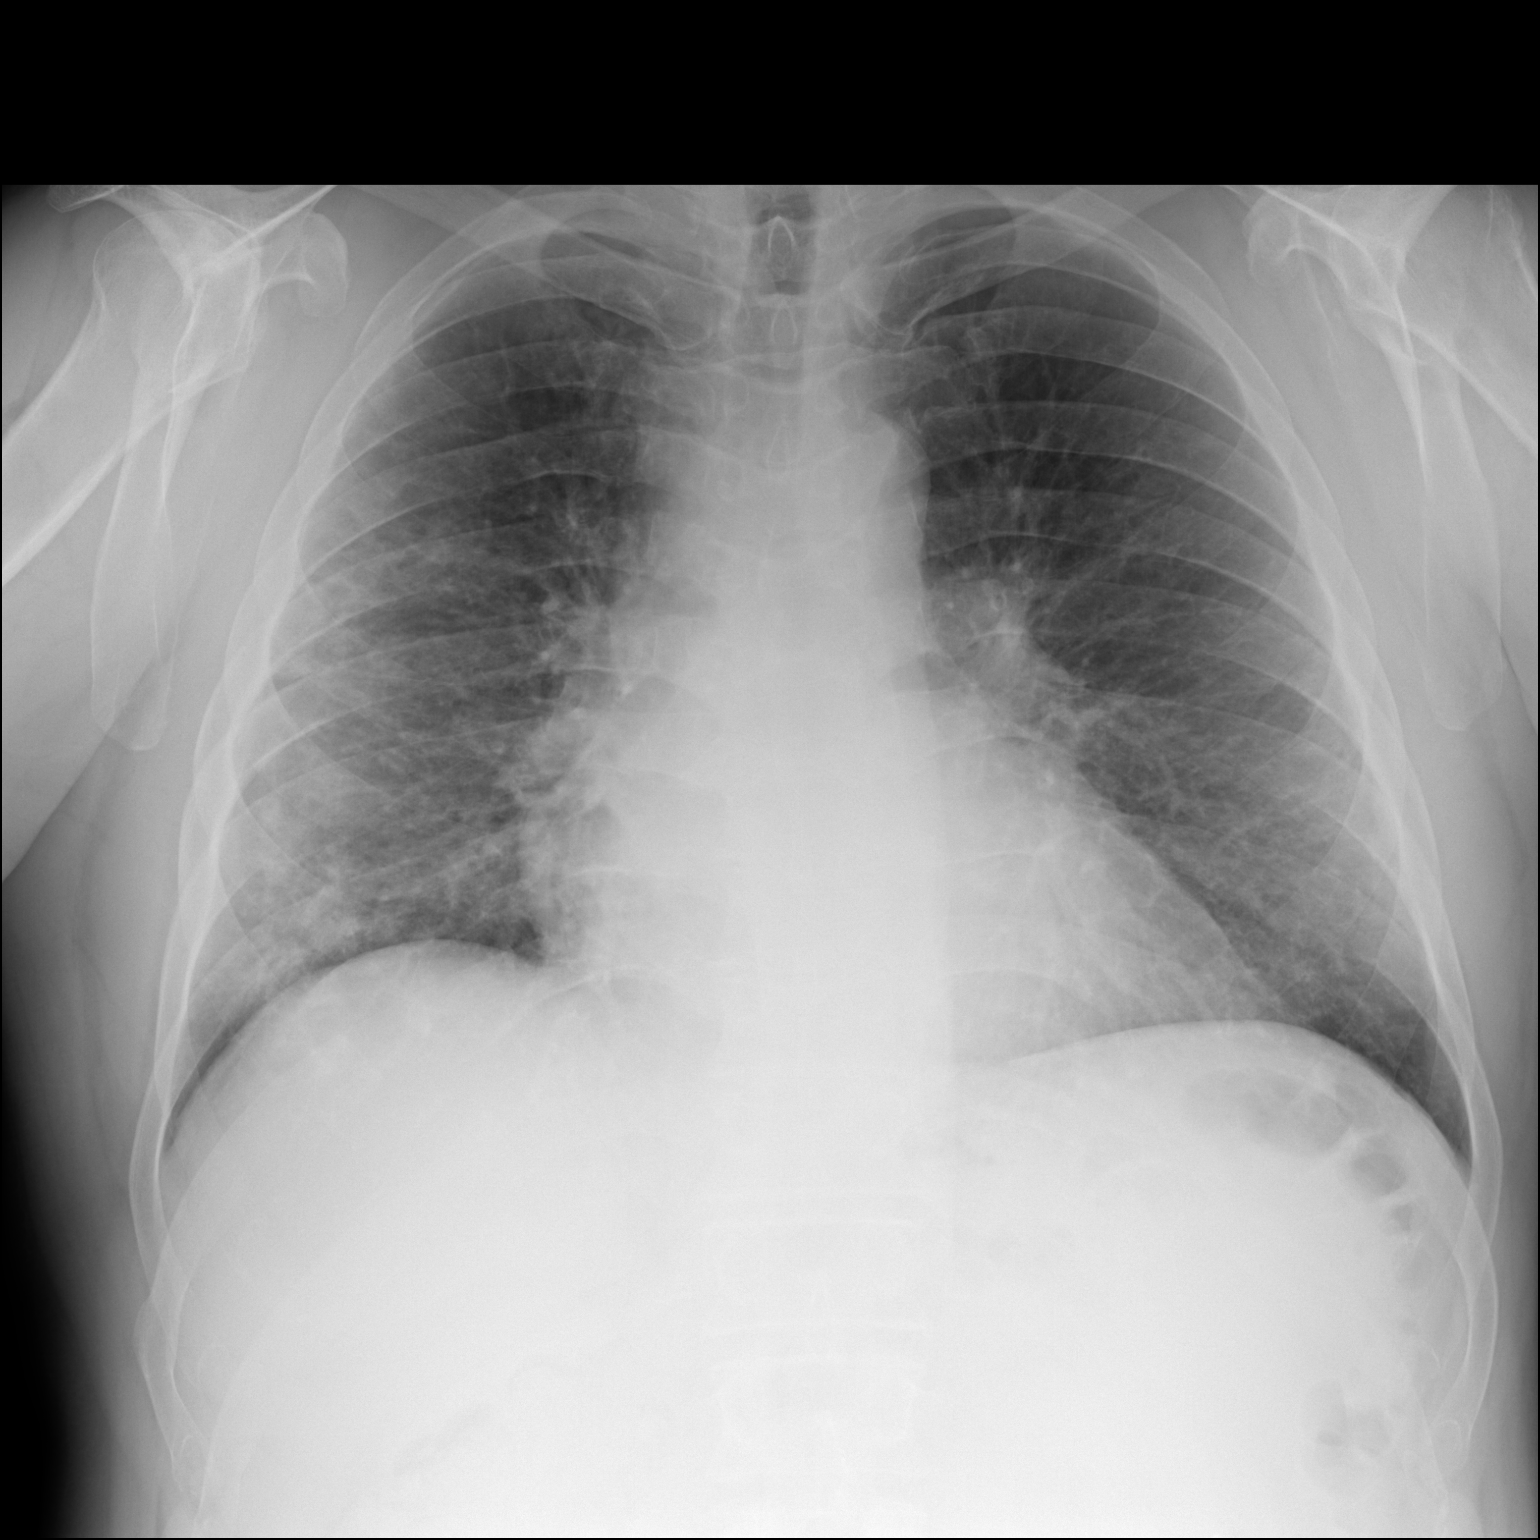

[dg chest 2 view (2 of 2)]
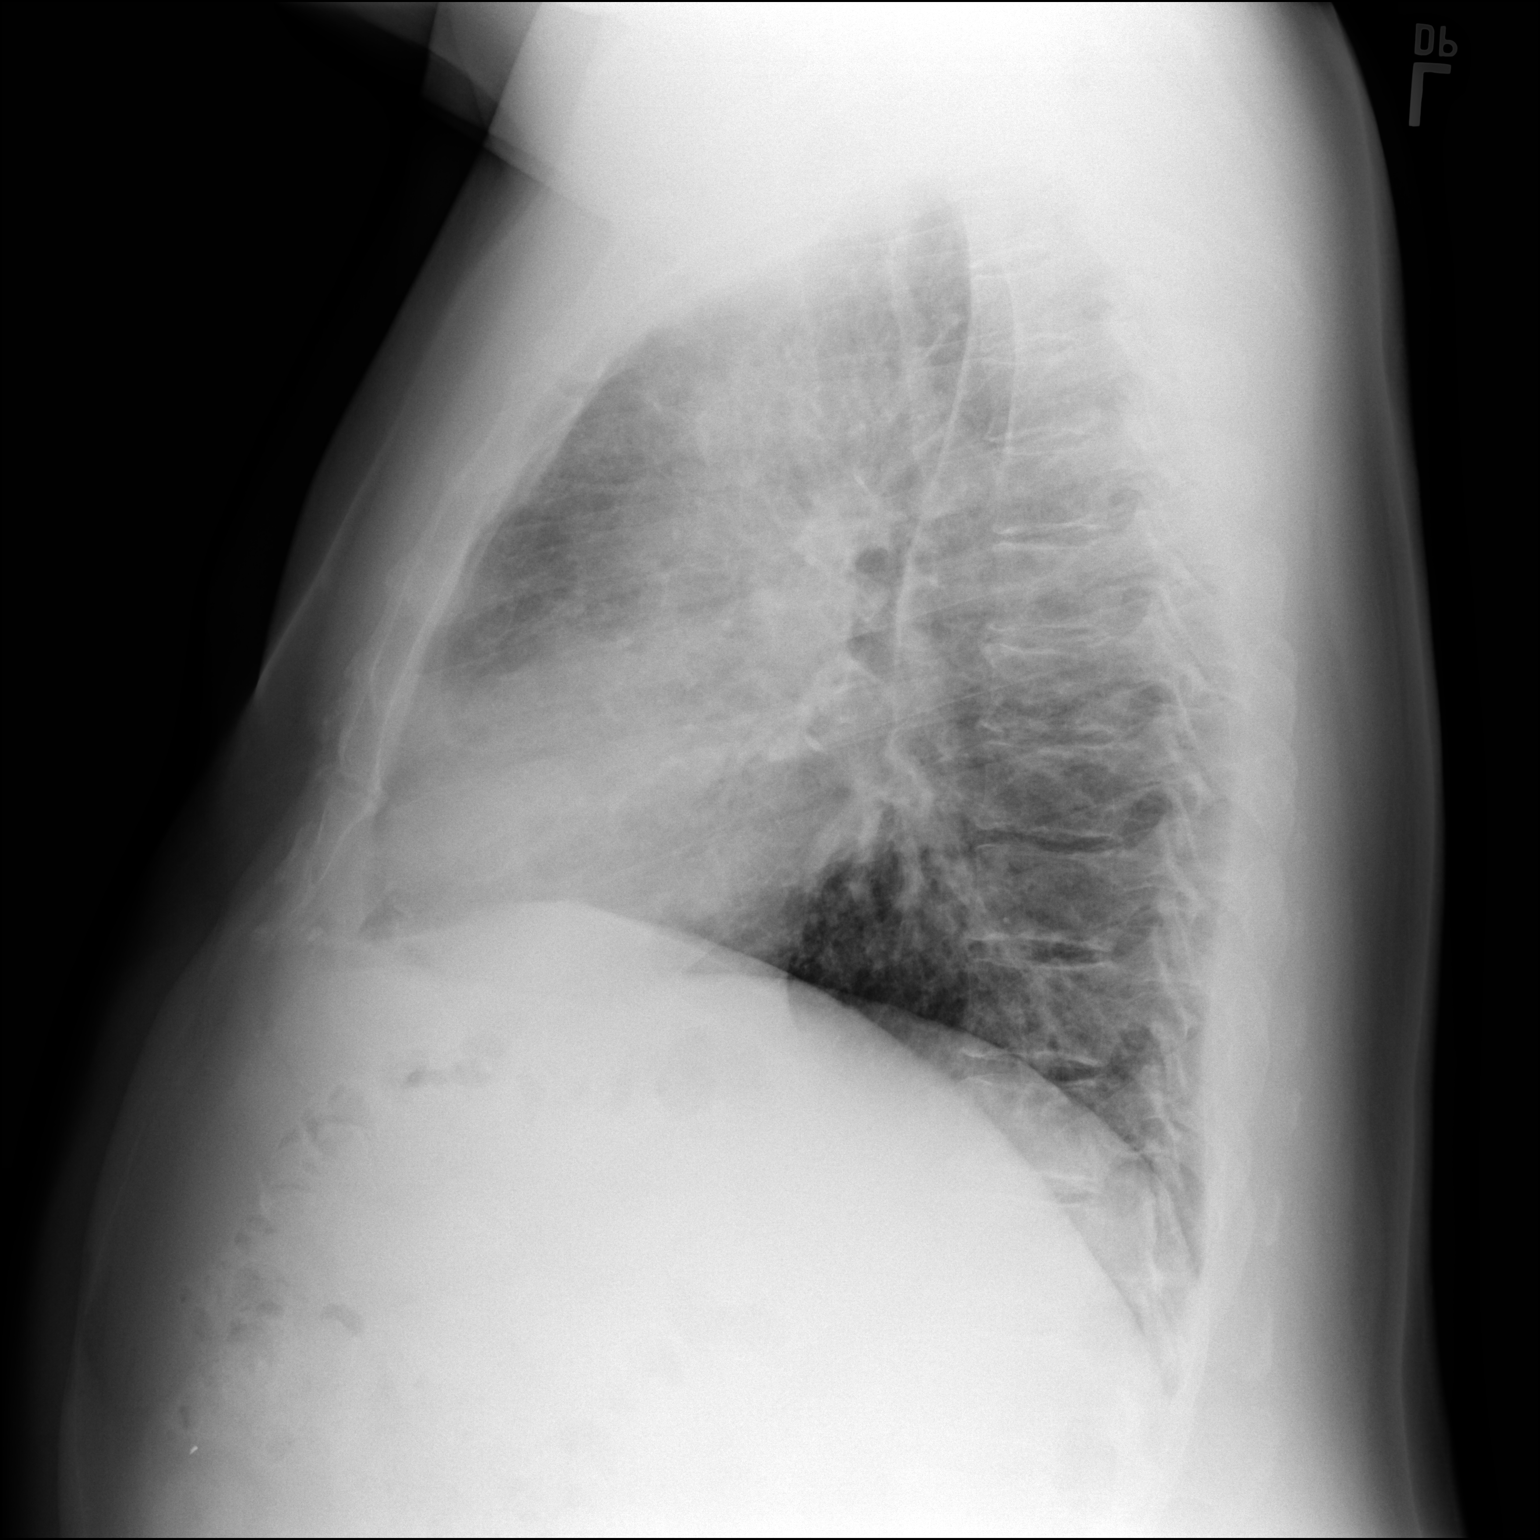

[2 of 2 positions shown; findings below may reference images not displayed]

FINDINGS: Peripheral infiltrate on the right as noted on prior CT but
progressive since the prior chest x-ray compatible with pneumonia.
Left lung is clear. No effusion. Mild cardiac enlargement without
heart failure
IMPRESSION: Peripheral infiltrate on the right compatible with pneumonia.

## 2021-04-05 ENCOUNTER — Ambulatory Visit: Payer: Commercial Managed Care - PPO | Admitting: Family Medicine

## 2021-05-06 ENCOUNTER — Telehealth: Payer: Self-pay | Admitting: Family Medicine

## 2021-05-06 NOTE — Telephone Encounter (Signed)
Pt came in office stating that he wants to reestablish with [facility]. Pt has not been seen at [facility] in three years. I informed pt that we will not be taking any new patients until August when the new providers are here. Pt understood ad told me that his daughter will be joining [facility] as NP. Pt left phone number to contact him. [phone]

## 2021-05-09 NOTE — Telephone Encounter (Signed)
Let him know I cannot right now

## 2021-11-15 ENCOUNTER — Ambulatory Visit (INDEPENDENT_AMBULATORY_CARE_PROVIDER_SITE_OTHER): Payer: Commercial Managed Care - PPO | Admitting: Family Medicine

## 2021-11-15 ENCOUNTER — Encounter: Payer: Self-pay | Admitting: Family Medicine

## 2021-11-15 ENCOUNTER — Other Ambulatory Visit: Payer: Self-pay

## 2021-11-15 ENCOUNTER — Ambulatory Visit (INDEPENDENT_AMBULATORY_CARE_PROVIDER_SITE_OTHER)
Admission: RE | Admit: 2021-11-15 | Discharge: 2021-11-15 | Disposition: A | Discharge status: Home / Self Care | Payer: Commercial Managed Care - PPO | Source: Ambulatory Visit | Attending: Family Medicine | Admitting: Family Medicine

## 2021-11-15 VITALS — BP 129/70 | HR 64 | Temp 98.0°F | Ht 69.0 in | Wt 239.4 lb

## 2021-11-15 DIAGNOSIS — E782 Mixed hyperlipidemia: Secondary | ICD-10-CM | POA: Diagnosis not present

## 2021-11-15 DIAGNOSIS — Z8042 Family history of malignant neoplasm of prostate: Secondary | ICD-10-CM

## 2021-11-15 DIAGNOSIS — Z125 Encounter for screening for malignant neoplasm of prostate: Secondary | ICD-10-CM

## 2021-11-15 DIAGNOSIS — E669 Obesity, unspecified: Secondary | ICD-10-CM

## 2021-11-15 DIAGNOSIS — K760 Fatty (change of) liver, not elsewhere classified: Secondary | ICD-10-CM

## 2021-11-15 DIAGNOSIS — R053 Chronic cough: Secondary | ICD-10-CM | POA: Diagnosis not present

## 2021-11-15 DIAGNOSIS — Z0000 Encounter for general adult medical examination without abnormal findings: Secondary | ICD-10-CM | POA: Insufficient documentation

## 2021-11-15 LAB — PSA: PSA: 0.34 ng/mL (ref 0.10–4.00)

## 2021-11-15 LAB — COMPREHENSIVE METABOLIC PANEL
ALT: 32 U/L (ref 0–53)
AST: 31 U/L (ref 0–37)
Albumin: 4.5 g/dL (ref 3.5–5.2)
Alkaline Phosphatase: 64 U/L (ref 39–117)
BUN: 19 mg/dL (ref 6–23)
CO2: 29 mEq/L (ref 19–32)
Calcium: 9.6 mg/dL (ref 8.4–10.5)
Chloride: 102 mEq/L (ref 96–112)
Creatinine, Ser: 0.87 mg/dL (ref 0.40–1.50)
GFR: 91.67 mL/min (ref >60.00)
Glucose, Bld: 93 mg/dL (ref 70–99)
Potassium: 4.3 mEq/L (ref 3.5–5.1)
Sodium: 137 mEq/L (ref 135–145)
Total Bilirubin: 0.7 mg/dL (ref 0.2–1.2)
Total Protein: 7.2 g/dL (ref 6.0–8.3)

## 2021-11-15 LAB — LIPID PANEL
Cholesterol: 204 mg/dL — ABNORMAL HIGH (ref 0–200)
HDL: 67.3 mg/dL (ref >39.00)
LDL Cholesterol: 122 mg/dL — ABNORMAL HIGH (ref 0–99)
NonHDL: 136.43
Total CHOL/HDL Ratio: 3
Triglycerides: 70 mg/dL (ref 0.0–149.0)
VLDL: 14 mg/dL (ref 0.0–40.0)

## 2021-11-15 NOTE — Assessment & Plan Note (Signed)
Recommend flu shot asap (per pt has to get in pharmacy)  Discussed shingrix also

## 2021-11-15 NOTE — Progress Notes (Signed)
Subjective:    Patient ID: Shane Gonzalez, male    DOB: [birth year], 65 y.o.   MRN: [id number]  This visit occurred during the SARS-CoV-2 public health emergency.  Safety protocols were in place, including screening questions prior to the visit, additional usage of staff PPE, and extensive cleaning of exam room while observing appropriate contact time as indicated for disinfecting solutions.   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   Lost his pcp   Daughter helps him eat healthy  Less burgers and fries and smaller portions  Occ [employer] [facility] [employer]   He was a pt here in the past and last appt was in 2014  [facility]   Had covid in 2021 and that put him in a fib  Has some residual cough  Cough in am for a little bit and then it goes away (clear phlegm if any)   Non smoker  A cigar in the past but not since   Daughter lives a mile away   BP Readings from Last 3 Encounters:  11/15/21 129/70  11/02/20 132/76  06/28/20 (!) 130/70   Pulse Readings from Last 3 Encounters:  11/15/21 64  11/02/20 80  06/28/20 72   H/o Parox a fib  Just one time - went back in normal rhythm    Hyperlipidemia Lab Results  Component Value Date   CHOL 220 (H) 06/29/2020   HDL 59 06/29/2020   LDLCALC 140 (H) 06/29/2020   LDLDIRECT 143.0 07/09/2013   TRIG 100 06/29/2020   CHOLHDL 3.7 06/29/2020  Is eating a little better -less fat and smaller portions   Has not had shingrix  Unsure if interested   Has not had the covid bivalent  Not planning   [facility]  Lab Results  Component Value Date   ALT 38 11/02/2020   AST 31 11/02/2020   ALKPHOS 47 12/15/2019    BILITOT 0.7 11/02/2020   Prostate cancer in family  Brother was in mid 60s  Nocturia 2-3 times per night -normal for him   Patient Active Problem List   Diagnosis Date Noted   Chronic cough 11/15/2021   Preventative health care 11/15/2021   Class 2 obesity 06/28/2020   Fatty liver 12/22/2019   PAF (paroxysmal atrial fibrillation) ([place]) 12/15/2019   History of COVID-19 12/12/2019   Family history of prostate cancer 04/04/2013   Routine general medical examination at a health care facility 11/13/2011   Prostate cancer screening 11/13/2011   Hyperlipidemia 08/17/2008   DIVERTICULITIS, HX OF 08/04/2008   Past Medical History:  Diagnosis Date   Bilateral bunions    both feet   Blood in stool    Hx of   Diverticulosis    Hx of   GERD (gastroesophageal reflux disease)    Hyperlipidemia    Toe deformity    2nd   Toenail fungus    rt big toe   Past Surgical History:  Procedure Laterality Date   ANTERIOR CRUCIATE LIGAMENT REPAIR     ARTHROSCOPIC REPAIR ACL     ARTHROSCOPIC REPAIR PCL  BUNIONECTOMY  2011   BUNIONECTOMY  2011   CLOSED REDUCTION SHOULDER DISLOCATION     left reduced under anesthesia   hammer toe repair  2011   REFRACTIVE SURGERY  2009   right shoulder      SHOULDER SURGERY     right   TENDON REPAIR  2011   VASECTOMY     VASECTOMY     WISDOM TOOTH EXTRACTION  2011   Social History   Tobacco Use   Smoking status: Former    Types: Cigars   Smokeless tobacco: Never   Tobacco comments:    only smokes once yearly  Vaping Use   Vaping Use: Never used  Substance Use Topics   Alcohol use: Not Currently    Alcohol/week: 0.0 standard drinks    Comment: weekends 4 beers   Drug use: No   Family History  Problem Relation Age of Onset   Cancer Mother        ? pancreatic   Stroke Mother    Vision loss Mother    Cancer Father        lung ca   Hypertension Father    Vision loss Father    Diverticulitis Sister    Prostate cancer Brother    Alcohol  abuse Brother    Cancer Brother    Diverticulitis Brother        x2 brothers   Heart disease Maternal Grandfather    Alcohol abuse Brother    Diverticulitis Brother    Cancer Paternal Aunt    Cancer Paternal Uncle    No Known Allergies No current outpatient medications on file prior to visit.   No current facility-administered medications on file prior to visit.    Review of Systems  Constitutional:  Negative for activity change, appetite change, fatigue, fever and unexpected weight change.  HENT:  Negative for congestion, rhinorrhea, sore throat and trouble swallowing.   Eyes:  Negative for pain, redness, itching and visual disturbance.  Respiratory:  Negative for cough, chest tightness, shortness of breath and wheezing.   Cardiovascular:  Negative for chest pain and palpitations.  Gastrointestinal:  Negative for abdominal pain, blood in stool, constipation, diarrhea and nausea.  Endocrine: Negative for cold intolerance, heat intolerance, polydipsia and polyuria.  Genitourinary:  Negative for difficulty urinating, dysuria, frequency and urgency.       Baseline nocturia  Musculoskeletal:  Negative for arthralgias, joint swelling and myalgias.  Skin:  Negative for pallor and rash.  Neurological:  Negative for dizziness, tremors, weakness, numbness and headaches.  Hematological:  Negative for adenopathy. Does not bruise/bleed easily.  Psychiatric/Behavioral:  Negative for decreased concentration and dysphoric mood. The patient is not nervous/anxious.       Objective:   Physical Exam Constitutional:      General: He is not in acute distress.    Appearance: Normal appearance. He is well-developed. He is obese. He is not ill-appearing.  HENT:     Head: Normocephalic and atraumatic.     Mouth/Throat:     Mouth: Mucous membranes are moist.  Eyes:     General: No scleral icterus.       Right eye: No discharge.        Left eye: No discharge.     Conjunctiva/sclera: Conjunctivae  normal.     Pupils: Pupils are equal, round, and reactive to light.  Neck:     Thyroid: No thyromegaly.     Vascular: No carotid bruit or JVD.  Cardiovascular:  Rate and Rhythm: Normal rate and regular rhythm.     Heart sounds: Normal heart sounds.    No gallop.  Pulmonary:     Effort: Pulmonary effort is normal. No respiratory distress.     Breath sounds: Normal breath sounds. No wheezing or rales.  Abdominal:     General: Bowel sounds are normal. There is no distension or abdominal bruit.     Palpations: Abdomen is soft. There is no mass.     Tenderness: There is no abdominal tenderness.     Comments: No suprapubic tenderness or fullness    Musculoskeletal:     Cervical back: Normal range of motion and neck supple.     Right lower leg: No edema.     Left lower leg: No edema.  Lymphadenopathy:     Cervical: No cervical adenopathy.  Skin:    General: Skin is warm and dry.     Coloration: Skin is not jaundiced or pale.     Findings: No erythema or rash.  Neurological:     Mental Status: He is alert.     Coordination: Coordination normal.     Deep Tendon Reflexes: Reflexes are normal and symmetric. Reflexes normal.  Psychiatric:        Mood and Affect: Mood normal.        Cognition and Memory: Cognition and memory normal.          Assessment & Plan:   Problem List Items Addressed This Visit       Digestive   Fatty liver    Labs ordered -hepatic panel  No symptoms  Recently diet is better  Last lab from 10/2020 were normal      Relevant Orders   Comprehensive metabolic panel     Other   Hyperlipidemia - Primary    Disc goals for lipids and reasons to control them Rev last labs with pt Rev low sat fat diet in detail Labs ordered Diet has improved from last check       Relevant Orders   Comprehensive metabolic panel   Lipid panel   Prostate cancer screening    fam hx -brother in 67s Pt has had nocturia 2-3 times nightly for years w/o change  Not  bothered and no problems with stream per pt      Relevant Orders   PSA   Family history of prostate cancer    PSA ordered  Brother had prostate cancer in his mid 90s      Relevant Orders   PSA   Class 2 obesity    Discussed how this problem influences overall health and the risks it imposes  Reviewed plan for weight loss with lower calorie diet (via better food choices and also portion control or program like weight watchers) and exercise building up to or more than 30 minutes 5 days per week including some aerobic activity   Commended on wt loss so far       Chronic cough    Since he had covid in 2021 Primarily in am, a little phlegm if any  cxr ordered Exam is reassuring  ? Post viral component  Would be on the look out for GERD symptoms as well      Relevant Orders   DG Chest 2 View   Preventative health care    Recommend flu shot asap (per pt has to get in pharmacy)  Discussed shingrix also

## 2021-11-15 NOTE — Assessment & Plan Note (Signed)
Disc goals for lipids and reasons to control them Rev last labs with pt Rev low sat fat diet in detail Labs ordered Diet has improved from last check

## 2021-11-15 NOTE — Assessment & Plan Note (Signed)
Discussed how this problem influences overall health and the risks it imposes  Reviewed plan for weight loss with lower calorie diet (via better food choices and also portion control or program like weight watchers) and exercise building up to or more than 30 minutes 5 days per week including some aerobic activity   Commended on wt loss so far  

## 2021-11-15 NOTE — Patient Instructions (Addendum)
If you are interested in the shingles vaccine series (Shingrix), call your insurance or pharmacy to check on coverage and location it must be given.  If affordable - you can schedule it here or at your pharmacy depending on coverage   [facility]  The flu is awful this year and it is 2 mo early   Lab today  Chest xray today

## 2021-11-15 NOTE — Assessment & Plan Note (Signed)
Since he had covid in 2021 Primarily in am, a little phlegm if any  cxr ordered Exam is reassuring  ? Post viral component  Would be on the look out for GERD symptoms as well

## 2021-11-15 NOTE — Assessment & Plan Note (Signed)
fam hx -brother in 35s Pt has had nocturia 2-3 times nightly for years w/o change  Not bothered and no problems with stream per pt

## 2021-11-15 NOTE — Assessment & Plan Note (Signed)
Labs ordered -hepatic panel  No symptoms  Recently diet is better  Last lab from 10/2020 were normal

## 2021-11-15 NOTE — Assessment & Plan Note (Signed)
PSA ordered  Brother had prostate cancer in his mid 3s

## 2021-12-02 ENCOUNTER — Other Ambulatory Visit: Payer: Self-pay

## 2021-12-02 ENCOUNTER — Telehealth (INDEPENDENT_AMBULATORY_CARE_PROVIDER_SITE_OTHER): Payer: Commercial Managed Care - PPO | Admitting: Nurse Practitioner

## 2021-12-02 DIAGNOSIS — J029 Acute pharyngitis, unspecified: Secondary | ICD-10-CM | POA: Insufficient documentation

## 2021-12-02 DIAGNOSIS — J069 Acute upper respiratory infection, unspecified: Secondary | ICD-10-CM | POA: Insufficient documentation

## 2021-12-02 DIAGNOSIS — R051 Acute cough: Secondary | ICD-10-CM | POA: Insufficient documentation

## 2021-12-02 LAB — POCT RAPID STREP A (OFFICE): Rapid Strep A Screen: NEGATIVE

## 2021-12-02 LAB — POCT INFLUENZA A/B
Influenza A, POC: NEGATIVE
Influenza B, POC: NEGATIVE

## 2021-12-02 NOTE — Assessment & Plan Note (Signed)
Given constellation of symptoms and patient improving with timeframe likely viral illness continue to monitor if he does not improve he will reach out we will consider antibiotic treatment.

## 2021-12-02 NOTE — Progress Notes (Signed)
Patient ID: Shane Gonzalez, male    DOB: May 15, 1956, 65 y.o.   MRN: [id number]  Virtual visit completed through [employer], a video enabled telemedicine application. Due to national recommendations of social distancing due to COVID-19, a virtual visit is felt to be most appropriate for this patient at this time. Reviewed limitations, risks, security and privacy concerns of performing a virtual visit and the availability of in person appointments. I also reviewed that there may be a patient responsible charge related to this service. The patient agreed to proceed.   Patient location: home Provider location: [address] at [facility], office Persons participating in this virtual visit: patient, provider   If any vitals were documented, they were collected by patient at home unless specified below.    There were no vitals taken for this visit.   CC: URI Subjective:   HPI: Shane Gonzalez is a 65 y.o. male presenting on 12/02/2021 for Other (//) and Exposure to Covid (Had 1st 2 covid vaccines. Daughter tested positive for Covid 12-27. Symptoms started this past Saturday (12-24) with nausea ands cough. Neg home covid test. Symptoms worsened and tested again 12-26 and it was negative. Has lost taste and smell. )   Symptoms started on 12/23 or 11/26/2021 Has covid tested 11/25/2021 and it was negative Went up to [place] and visited his daughter Tested there and it was negative. States that he feels that he is improving Has taken mucinex and dyquill with some relief Halls cough drops Moderna x 2   Relevant past medical, surgical, family and social history reviewed and updated as indicated. Interim medical history since our last visit reviewed. Allergies and medications reviewed and updated. No outpatient medications prior to visit.   No facility-administered medications prior to visit.     Per HPI unless specifically indicated in ROS section below Review of Systems  Constitutional:  Negative  for chills, fatigue and fever.  HENT:  Positive for congestion, rhinorrhea and sore throat (was but has improved.). Negative for ear discharge, ear pain, sinus pressure and sinus pain.   Respiratory:  Positive for cough (thin. yellow). Negative for shortness of breath.   Cardiovascular:  Negative for chest pain.  Gastrointestinal:  Negative for abdominal pain, diarrhea, nausea and vomiting.  Musculoskeletal:  Negative for arthralgias and myalgias.  Neurological:  Negative for headaches.  Objective:  There were no vitals taken for this visit.  Wt Readings from Last 3 Encounters:  11/15/21 239 lb 6 oz (108.6 kg)  11/02/20 256 lb (116.1 kg)  06/28/20 (!) 254 lb (115.2 kg)       Physical exam: Gen: alert, NAD, not ill appearing Pulm: speaks in complete sentences without increased work of breathing Psych: normal mood, normal thought content      Results for orders placed or performed in visit on 11/15/21  Comprehensive metabolic panel  Result Value Ref Range   Sodium 137 135 - 145 mEq/L   Potassium 4.3 3.5 - 5.1 mEq/L   Chloride 102 96 - 112 mEq/L   CO2 29 19 - 32 mEq/L   Glucose, Bld 93 70 - 99 mg/dL   BUN 19 6 - 23 mg/dL   Creatinine, Ser 0.87 0.40 - 1.50 mg/dL   Total Bilirubin 0.7 0.2 - 1.2 mg/dL   Alkaline Phosphatase 64 39 - 117 U/L   AST 31 0 - 37 U/L   ALT 32 0 - 53 U/L   Total Protein 7.2 6.0 - 8.3 g/dL   Albumin 4.5 3.5 -  5.2 g/dL   GFR 91.67 >60.00 mL/min   Calcium 9.6 8.4 - 10.5 mg/dL  Lipid panel  Result Value Ref Range   Cholesterol 204 (H) 0 - 200 mg/dL   Triglycerides 70.0 0.0 - 149.0 mg/dL   HDL 67.30 >39.00 mg/dL   VLDL 14.0 0.0 - 40.0 mg/dL   LDL Cholesterol 122 (H) 0 - 99 mg/dL   Total CHOL/HDL Ratio 3    NonHDL 136.43   PSA  Result Value Ref Range   PSA 0.34 0.10 - 4.00 ng/mL   Assessment & Plan:   Problem List Items Addressed This Visit       Respiratory   Viral upper respiratory tract infection    Given constellation of symptoms and  patient improving with timeframe likely viral illness continue to monitor if he does not improve he will reach out we will consider antibiotic treatment.        Other   Acute cough    Continue over-the-counter symptomatic treatment.  Pending COVID test negative flu and strep test      Relevant Orders   Influenza A/B (Completed)   Novel Coronavirus, NAA ([facility])   Sore throat - Primary    Patient's flu and strep are negative.  Continue symptom medic treatment over-the-counter.  Pending COVID test      Relevant Orders   Influenza A/B (Completed)   Rapid Strep A (Completed)   Novel Coronavirus, NAA ([facility])     No orders of the defined types were placed in this encounter.  No orders of the defined types were placed in this encounter.   I discussed the assessment and treatment plan with the patient. The patient was provided an opportunity to ask questions and all were answered. The patient agreed with the plan and demonstrated an understanding of the instructions. The patient was advised to call back or seek an in-person evaluation if the symptoms worsen or if the condition fails to improve as anticipated.  Follow up plan: No follow-ups on file.  Romilda Garret, NP

## 2021-12-02 NOTE — Assessment & Plan Note (Signed)
Patient's flu and strep are negative.  Continue symptom medic treatment over-the-counter.  Pending COVID test

## 2021-12-02 NOTE — Assessment & Plan Note (Signed)
Continue over-the-counter symptomatic treatment.  Pending COVID test negative flu and strep test

## 2021-12-04 LAB — NOVEL CORONAVIRUS, NAA: SARS-CoV-2, NAA: DETECTED — AB

## 2021-12-04 LAB — SARS-COV-2, NAA 2 DAY TAT

## 2022-07-25 NOTE — Progress Notes (Signed)
Shane Gonzalez T. Halynn Reitano, MD, [place] at [facility] [place], [address]  Phone: [phone]  FAX: [phone]  Shane Gonzalez - 66 y.o. male  MRN [id number]  Date of Birth: 26-Oct-1956  Date: 07/27/2022  PCP: Abner Greenspan, MD  Referral: Abner Greenspan, MD  Chief Complaint  Patient presents with   Foot Pain    Left Heel   Subjective:   Shane Gonzalez is a 66 y.o. very pleasant male patient with Body mass index is 35.9 kg/m. who presents with the following:  Very pleasant gentleman who presents with some ongoing heel pain that has been present for months.  Roughly 5 months of pain on the plantar aspect of his heel that has been quite debilitating.  He has pain worse first thing in the morning when he wakes up and when he is rising from a seated position after rest.  He is limping quite a bit at baseline, but he does think he has gotten better in recent weeks.  Also got additional history from his daughter who is worried and he is limping and limiting his activities due to his heel pain.  He has been using a cold ice water bottle on the bottom of his foot for additional management.  Other additional conservative care and oral medications have not been significantly helpful.  He is doing occasional stretching.  History is significant for prior bunionectomy and what sounds as if this was a posterior tibialis reconstruction, but I do not have the operative reports.  Review of Systems is noted in the HPI, as appropriate  Objective:   BP 112/60   Pulse 64   Temp 98.1 F (36.7 C) (Oral)   Ht '5\' 9"'$  (1.753 m)   Wt 243 lb 2 oz (110.3 kg)   SpO2 98%   BMI 35.90 kg/m   GEN: No acute distress; alert,appropriate. PULM: Breathing comfortably in no respiratory distress PSYCH: Normally interactive.   Foot exam: L Echymosis: no Edema: no ROM: full LE B Gait: heel toe, non-antalgic MT pain: no Callus pattern: none Lateral  Mall: NT Medial Mall: NT Talus: NT Navicular: NT Calcaneous: NT Metatarsals: NT 5th MT: NT Phalanges: NT Achilles: NT Plantar Fascia: tender, medial along PF. Pain with forced dorsi Fat Pad: NT Peroneals: NT Post Tib: NT Great Toe: Nml motion Ant Drawer: neg Other foot breakdown: none Long arch: preserved Transverse arch: preserved Hindfoot breakdown: none Sensation: intact   Laboratory and Imaging Data:  Assessment and Plan:     ICD-10-CM   1. Plantar fasciitis, left  M72.2      Classic plantar fasciitis, and hopefully he has turned the corner and started getting better now, with symptoms improving.  He does have some mild pain with palpation at the insertion point.  We reviewed that this symptoms often last on average statistically 6 to 9 months.  His footwear is very pliable, and I made a number of different recommendations in terms of footwear, particularly with more rigid midsole.  Also gave him an arch binder and recommended some Tuli's heel cups.  We reviewed that stretching is critically important to the treatment of PF.  Reviewed footwear. Rigid soles have been shown to help with PF. Night splints can sometimes help. Reviewed rehab of stretching and calf raises.   Could benefit from a corticosteroid injection, orthotics, or other measures if conservative treatment fails.   Social: Right now this is limiting his ability to walk  and hike for exercise, and do any form of exercise with his family.  Patient Instructions  Please read handouts on Plantar Fascitis.  STRETCHING and Strengthening program critically important.  Strengthening on foot and calf muscles as seen in handout. Calf raises, 2 legged, then 1 legged. Foot massage with tennis ball. Ice massage.  Towel Scrunches: get a towel or hand towel, use toes to pick up and scrunch up the towel.  Marble pick-ups, practice picking up marbles with toes and placing into a cup  NEEDS TO BE DONE EVERY  DAY  Recommended over the counter insoles. (Spenco or Hapad)  A rigid shoe with good arch support helps: Dansko (great), Jennet Maduro, Merrell No easily bendable shoes.   Tuli's heel cups    Disposition: No follow-ups on file.  [employer] One speech-to-text software was used for transcription in this dictation.  Possible transcriptional errors can occur using Editor, commissioning.   Signed,  Maud Deed. Malerie Eakins, MD   No outpatient encounter medications on file as of 07/27/2022.   No facility-administered encounter medications on file as of 07/27/2022.

## 2022-07-27 ENCOUNTER — Encounter: Payer: Self-pay | Admitting: Family Medicine

## 2022-07-27 ENCOUNTER — Ambulatory Visit (INDEPENDENT_AMBULATORY_CARE_PROVIDER_SITE_OTHER): Payer: Commercial Managed Care - PPO | Admitting: Family Medicine

## 2022-07-27 VITALS — BP 112/60 | HR 64 | Temp 98.1°F | Ht 69.0 in | Wt 243.1 lb

## 2022-07-27 DIAGNOSIS — M722 Plantar fascial fibromatosis: Secondary | ICD-10-CM

## 2022-07-27 NOTE — Patient Instructions (Signed)
Please read handouts on Plantar Fascitis.  STRETCHING and Strengthening program critically important.  Strengthening on foot and calf muscles as seen in handout. Calf raises, 2 legged, then 1 legged. Foot massage with tennis ball. Ice massage.  Towel Scrunches: get a towel or hand towel, use toes to pick up and scrunch up the towel.  Marble pick-ups, practice picking up marbles with toes and placing into a cup  NEEDS TO BE DONE EVERY DAY  Recommended over the counter insoles. (Spenco or Hapad)  A rigid shoe with good arch support helps: Dansko (great), Jennet Maduro, Merrell No easily bendable shoes.   Tuli's heel cups

## 2022-12-12 ENCOUNTER — Encounter: Payer: Self-pay | Admitting: Family Medicine

## 2023-04-02 DIAGNOSIS — H524 Presbyopia: Secondary | ICD-10-CM | POA: Diagnosis not present

## 2023-04-02 DIAGNOSIS — H1045 Other chronic allergic conjunctivitis: Secondary | ICD-10-CM | POA: Diagnosis not present

## 2023-04-02 DIAGNOSIS — H179 Unspecified corneal scar and opacity: Secondary | ICD-10-CM | POA: Diagnosis not present

## 2023-04-02 DIAGNOSIS — H25813 Combined forms of age-related cataract, bilateral: Secondary | ICD-10-CM | POA: Diagnosis not present

## 2023-04-02 DIAGNOSIS — H40013 Open angle with borderline findings, low risk, bilateral: Secondary | ICD-10-CM | POA: Diagnosis not present

## 2023-06-01 ENCOUNTER — Telehealth: Payer: Self-pay | Admitting: Family Medicine

## 2023-06-01 DIAGNOSIS — K760 Fatty (change of) liver, not elsewhere classified: Secondary | ICD-10-CM

## 2023-06-01 DIAGNOSIS — Z125 Encounter for screening for malignant neoplasm of prostate: Secondary | ICD-10-CM

## 2023-06-01 DIAGNOSIS — I480 Paroxysmal atrial fibrillation: Secondary | ICD-10-CM

## 2023-06-01 DIAGNOSIS — Z131 Encounter for screening for diabetes mellitus: Secondary | ICD-10-CM

## 2023-06-01 DIAGNOSIS — R7303 Prediabetes: Secondary | ICD-10-CM | POA: Insufficient documentation

## 2023-06-01 DIAGNOSIS — E782 Mixed hyperlipidemia: Secondary | ICD-10-CM

## 2023-06-01 DIAGNOSIS — R053 Chronic cough: Secondary | ICD-10-CM

## 2023-06-01 NOTE — Telephone Encounter (Signed)
-----   Message from Lovena Neighbours, RT sent at 05/31/2023 10:45 AM EDT ----- Regarding: persistent cough Patient is scheduled for CPX labs, please order future labs, Thank you-Erin

## 2023-06-11 ENCOUNTER — Other Ambulatory Visit (INDEPENDENT_AMBULATORY_CARE_PROVIDER_SITE_OTHER): Payer: Medicare Other

## 2023-06-11 DIAGNOSIS — Z131 Encounter for screening for diabetes mellitus: Secondary | ICD-10-CM

## 2023-06-11 DIAGNOSIS — E782 Mixed hyperlipidemia: Secondary | ICD-10-CM | POA: Diagnosis not present

## 2023-06-11 DIAGNOSIS — R053 Chronic cough: Secondary | ICD-10-CM

## 2023-06-11 DIAGNOSIS — I480 Paroxysmal atrial fibrillation: Secondary | ICD-10-CM | POA: Diagnosis not present

## 2023-06-11 DIAGNOSIS — K760 Fatty (change of) liver, not elsewhere classified: Secondary | ICD-10-CM | POA: Diagnosis not present

## 2023-06-11 DIAGNOSIS — Z125 Encounter for screening for malignant neoplasm of prostate: Secondary | ICD-10-CM

## 2023-06-11 LAB — CBC WITH DIFFERENTIAL/PLATELET
Basophils Absolute: 0 10*3/uL (ref 0.0–0.1)
Basophils Relative: 0.3 % (ref 0.0–3.0)
Eosinophils Absolute: 0.2 10*3/uL (ref 0.0–0.7)
Eosinophils Relative: 3.2 % (ref 0.0–5.0)
HCT: 43 % (ref 39.0–52.0)
Hemoglobin: 14.1 g/dL (ref 13.0–17.0)
Lymphocytes Relative: 21.2 % (ref 12.0–46.0)
Lymphs Abs: 1 10*3/uL (ref 0.7–4.0)
MCHC: 32.8 g/dL (ref 30.0–36.0)
MCV: 96.9 fl (ref 78.0–100.0)
Monocytes Absolute: 0.3 10*3/uL (ref 0.1–1.0)
Monocytes Relative: 6.4 % (ref 3.0–12.0)
Neutro Abs: 3.3 10*3/uL (ref 1.4–7.7)
Neutrophils Relative %: 68.9 % (ref 43.0–77.0)
Platelets: 195 10*3/uL (ref 150.0–400.0)
RBC: 4.44 Mil/uL (ref 4.22–5.81)
RDW: 13.7 % (ref 11.5–15.5)
WBC: 4.8 10*3/uL (ref 4.0–10.5)

## 2023-06-11 LAB — LIPID PANEL
Cholesterol: 199 mg/dL (ref 0–200)
HDL: 65 mg/dL (ref >39.00)
LDL Cholesterol: 117 mg/dL — ABNORMAL HIGH (ref 0–99)
NonHDL: 134.12
Total CHOL/HDL Ratio: 3
Triglycerides: 88 mg/dL (ref 0.0–149.0)
VLDL: 17.6 mg/dL (ref 0.0–40.0)

## 2023-06-11 LAB — COMPREHENSIVE METABOLIC PANEL
ALT: 28 U/L (ref 0–53)
AST: 22 U/L (ref 0–37)
Albumin: 4.4 g/dL (ref 3.5–5.2)
Alkaline Phosphatase: 54 U/L (ref 39–117)
BUN: 18 mg/dL (ref 6–23)
CO2: 27 mEq/L (ref 19–32)
Calcium: 9.3 mg/dL (ref 8.4–10.5)
Chloride: 104 mEq/L (ref 96–112)
Creatinine, Ser: 0.85 mg/dL (ref 0.40–1.50)
GFR: 91.31 mL/min (ref >60.00)
Glucose, Bld: 98 mg/dL (ref 70–99)
Potassium: 4.6 mEq/L (ref 3.5–5.1)
Sodium: 139 mEq/L (ref 135–145)
Total Bilirubin: 0.6 mg/dL (ref 0.2–1.2)
Total Protein: 6.9 g/dL (ref 6.0–8.3)

## 2023-06-11 LAB — PSA, MEDICARE: PSA: 0.61 ng/ml (ref 0.10–4.00)

## 2023-06-11 LAB — TSH: TSH: 2.22 u[IU]/mL (ref 0.35–5.50)

## 2023-06-11 LAB — HEMOGLOBIN A1C: Hgb A1c MFr Bld: 5.5 % (ref 4.6–6.5)

## 2023-06-20 ENCOUNTER — Encounter: Payer: Self-pay | Admitting: Family Medicine

## 2023-06-20 ENCOUNTER — Ambulatory Visit (INDEPENDENT_AMBULATORY_CARE_PROVIDER_SITE_OTHER): Payer: Medicare Other | Admitting: Family Medicine

## 2023-06-20 VITALS — BP 114/78 | HR 73 | Temp 97.7°F | Ht 68.5 in | Wt 229.5 lb

## 2023-06-20 DIAGNOSIS — E782 Mixed hyperlipidemia: Secondary | ICD-10-CM

## 2023-06-20 DIAGNOSIS — K760 Fatty (change of) liver, not elsewhere classified: Secondary | ICD-10-CM | POA: Diagnosis not present

## 2023-06-20 DIAGNOSIS — Z23 Encounter for immunization: Secondary | ICD-10-CM

## 2023-06-20 DIAGNOSIS — Z0000 Encounter for general adult medical examination without abnormal findings: Secondary | ICD-10-CM | POA: Diagnosis not present

## 2023-06-20 DIAGNOSIS — I480 Paroxysmal atrial fibrillation: Secondary | ICD-10-CM

## 2023-06-20 DIAGNOSIS — Z131 Encounter for screening for diabetes mellitus: Secondary | ICD-10-CM

## 2023-06-20 DIAGNOSIS — E6609 Other obesity due to excess calories: Secondary | ICD-10-CM

## 2023-06-20 DIAGNOSIS — Z125 Encounter for screening for malignant neoplasm of prostate: Secondary | ICD-10-CM | POA: Diagnosis not present

## 2023-06-20 DIAGNOSIS — Z6834 Body mass index (BMI) 34.0-34.9, adult: Secondary | ICD-10-CM

## 2023-06-20 DIAGNOSIS — N529 Male erectile dysfunction, unspecified: Secondary | ICD-10-CM | POA: Insufficient documentation

## 2023-06-20 DIAGNOSIS — E66811 Obesity, class 1: Secondary | ICD-10-CM

## 2023-06-20 NOTE — Assessment & Plan Note (Signed)
Problems mt erections  Reviewed last cardiology noted and EKG/studies May be interested in viagra trial in the future

## 2023-06-20 NOTE — Assessment & Plan Note (Signed)
Reviewed health habits including diet and exercise and skin cancer prevention Reviewed appropriate screening tests for age  Also reviewed health mt list, fam hx and immunization status , as well as social and family history   See HPI Labs reviewed and ordered Encouraged flu shot in the fall  Declines covid vaccine  Will inquire re: tetanus shot at pharmacy  Considering shingrix vaccine  Prevnar 20 given today  Discussed bone health and need for exercise  Colonoscopy utd 07/2015 Psa is stable Given materials to work on advance directive Hearing and vision screens are reassuring  Utd oph care Encouraged dermatology screening visit  PHQ score of 0 Excellent functionality/no help needed for ADLs  Reviewed care team  No cognitive concerns at all

## 2023-06-20 NOTE — Assessment & Plan Note (Signed)
None since he had covid  No medications

## 2023-06-20 NOTE — Assessment & Plan Note (Signed)
Discussed how this problem influences overall health and the risks it imposes  Reviewed plan for weight loss with lower calorie diet (via better food choices and also portion control or program like weight watchers) and exercise building up to or more than 30 minutes 5 days per week including some aerobic activity    

## 2023-06-20 NOTE — Patient Instructions (Addendum)
I recommend a flu shot every fall   You are due for a tetanus shot  You can get that at the pharmacy   Prenar 20 vaccine today (for pneumonia)   Start some vitamin D3 over the counter 2000 international units daily for bone health   Add some strength training to your routine, this is important for bone and brain health and can reduce your risk of falls and help your body use insulin properly and regulate weight  Light weights, exercise bands , and internet videos are a good way to start  Yoga (chair or regular), machines , floor exercises or a gym with machines are also good options   Please work on the advanced directive (blue packet) and get it notarized   Call [facility] to get an appt for skin cancer screening  Let us know if you need a referral  Make a habit of sunscreen when you are outside

## 2023-06-20 NOTE — Assessment & Plan Note (Signed)
Lab Results  Component Value Date   PSA 0.61 06/11/2023   PSA 0.34 11/15/2021   PSA 0.32 11/02/2020    Brother had prostate cancer Nocturia is unchanged and no flow changes

## 2023-06-20 NOTE — Assessment & Plan Note (Signed)
Lab Results  Component Value Date   ALT 28 06/11/2023   AST 22 06/11/2023   ALKPHOS 54 06/11/2023   BILITOT 0.6 06/11/2023   Working on weight loss

## 2023-06-20 NOTE — Assessment & Plan Note (Signed)
Disc goals for lipids and reasons to control them Rev last labs with pt Rev low sat fat diet in detail Labs ordered Diet has improved from last check

## 2023-06-20 NOTE — Progress Notes (Signed)
Subjective:    Patient ID: Shane Gonzalez, male    DOB: November 20, 1956, 67 y.o.   MRN: [id number]  HPI  Wt Readings from Last 3 Encounters:  06/20/23 229 lb 8 oz (104.1 kg)  07/27/22 243 lb 2 oz (110.3 kg)  11/15/21 239 lb 6 oz (108.6 kg)   34.39 kg/m  Vitals:   06/20/23 0922  BP: 114/78  Pulse: 73  Temp: 97.7 F (36.5 C)  SpO2: 97%   Pt presents for welcome to medicare visit   I have personally reviewed the Medicare Annual Wellness questionnaire and have noted 1. The patient's medical and social history 2. Their use of alcohol, tobacco or illicit drugs 3. Their current medications and supplements 4. The patient's functional ability including ADL's, fall risks, home safety risks and hearing or visual             impairment. 5. Diet and physical activities 6. Evidence for depression or mood disorders  The patients weight, height, BMI have been recorded in the chart and visual acuity is per eye clinic.  I have made referrals, counseling and provided education to the patient based review of the above and I have provided the pt with a written personalized care plan for preventive services. Reviewed and updated provider list, see scanned forms.  See scanned forms.  Routine anticipatory guidance given to patient.  See health maintenance. Colon cancer screening  colonoscopy 07/2015  Flu vaccine due in fall  Not open to covid shots any more  Tetanus vaccine  due  Pneumovax due- Zoster vaccine due  Bone health Falls: none  Fractures: none  Supplements-none  Exercise - gym   Colonoscopy 07/2015   Prostate cancer screening Lab Results  Component Value Date   PSA 0.61 06/11/2023   PSA 0.34 11/15/2021   PSA 0.32 11/02/2020   Brother had prostate cancer in 60s No change in urinary habits  Nocturia 3 times- his whole life  No change in stream   Advance directive: given the paperwork to do  Cognitive function addressed- see scanned forms- and if abnormal then additional  documentation follows. 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   ROS: See HPI.  Otherwise negative.    Weight : Wt Readings from Last 3 Encounters:  06/20/23 229 lb 8 oz (104.1 kg)  07/27/22 243 lb 2 oz (110.3 kg)  11/15/21 239 lb 6 oz (108.6 kg)   34.39 kg/m  Vitals:   06/20/23 0922  BP: 114/78  Pulse: 73  Temp: 97.7 F (36.5 C)  SpO2: 97%   Weight is down Joined a gym- plans to make a schedule   Is eating better  Smaller portions   Hearing/vision: Hearing Screening   500Hz  1000Hz  2000Hz  4000Hz   Right ear 40 40 40 0  Left ear 40 40 40 40   Vision Screening   Right eye Left eye Both eyes  Without correction     With correction 20/25 20/25 20/15    Feels good overall  Some age related aches and pains  Weight loss helps   Sees oph yearly  [facility] practice Wears glasses   Interested in getting moles checked  Not in the sun a lot    PHQ:    06/20/2023    9:27 AM 11/15/2021   10:14 AM 11/02/2020    8:27 AM  06/28/2020    3:22 PM 02/12/2019   10:48 AM  Depression screen PHQ 2/9  Decreased Interest 0 0 0 0 0  Down, Depressed, Hopeless 0 0 0 0 0  PHQ - 2 Score 0 0 0 0 0  Altered sleeping 0 0 0 0   Tired, decreased energy 0 0 0 0   Change in appetite 0 0 0 0   Feeling bad or failure about yourself  0 0 0 0   Trouble concentrating 0 0 0 0   Moving slowly or fidgety/restless 0 0 0 0   Suicidal thoughts 0 0 0 0   PHQ-9 Score 0 0 0 0   Difficult doing work/chores Not difficult at all Not difficult at all Not difficult at all Not difficult at all     ED Trouble keeping an erection  In the past year  No pain   May want to try medication in the future   ADLs: no help needed   Functionality:very good   Care team  Wendi Lastra- pcp  Cardiology -[facility]  GI- Dr Russella Dar  History of a fib in past  Pulse Readings from Last 3  Encounters:  06/20/23 73  07/27/22 64  11/15/21 64    Hyperlipidemia Lab Results  Component Value Date   CHOL 199 06/11/2023   CHOL 204 (H) 11/15/2021   CHOL 220 (H) 06/29/2020   Lab Results  Component Value Date   HDL 65.00 06/11/2023   HDL 67.30 11/15/2021   HDL 59 06/29/2020   Lab Results  Component Value Date   LDLCALC 117 (H) 06/11/2023   LDLCALC 122 (H) 11/15/2021   LDLCALC 140 (H) 06/29/2020   Lab Results  Component Value Date   TRIG 88.0 06/11/2023   TRIG 70.0 11/15/2021   TRIG 100 06/29/2020   Lab Results  Component Value Date   CHOLHDL 3 06/11/2023   CHOLHDL 3 11/15/2021   CHOLHDL 3.7 06/29/2020   Lab Results  Component Value Date   LDLDIRECT 143.0 07/09/2013   LDLDIRECT 172.9 03/26/2013   LDLDIRECT 133.3 11/21/2011  Looking good    DM screening Lab Results  Component Value Date   HGBA1C 5.5 06/11/2023      Other labs Lab Results  Component Value Date   WBC 4.8 06/11/2023   HGB 14.1 06/11/2023   HCT 43.0 06/11/2023   MCV 96.9 06/11/2023   PLT 195.0 06/11/2023   Lab Results  Component Value Date   TSH 2.22 06/11/2023   Lab Results  Component Value Date   NA 139 06/11/2023   K 4.6 06/11/2023   CO2 27 06/11/2023   GLUCOSE 98 06/11/2023   BUN 18 06/11/2023   CREATININE 0.85 06/11/2023   CALCIUM 9.3 06/11/2023   GFR 91.31 06/11/2023   GFRNONAA 81 01/07/2020   Lab Results  Component Value Date   ALT 28 06/11/2023   AST 22 06/11/2023   ALKPHOS 54 06/11/2023   BILITOT 0.6 06/11/2023       Patient Active Problem List   Diagnosis Date Noted   Welcome to Medicare preventive visit 06/20/2023   ED (erectile dysfunction) 06/20/2023   Diabetes mellitus screening 06/01/2023   Chronic cough 11/15/2021   Obesity 06/28/2020   Fatty liver 12/22/2019   PAF (paroxysmal atrial fibrillation) (HCC) 12/15/2019   History of COVID-19 12/12/2019   Family history of prostate cancer 04/04/2013   Routine general medical examination at a  health care facility 11/13/2011   Prostate cancer screening 11/13/2011   Hyperlipidemia  08/17/2008   DIVERTICULITIS, HX OF 08/04/2008   Past Medical History:  Diagnosis Date   Bilateral bunions    both feet   Blood in stool    Hx of   Diverticulosis    Hx of   GERD (gastroesophageal reflux disease)    Hyperlipidemia    Toe deformity    2nd   Toenail fungus    rt big toe   Past Surgical History:  Procedure Laterality Date   ANTERIOR CRUCIATE LIGAMENT REPAIR     ARTHROSCOPIC REPAIR ACL     ARTHROSCOPIC REPAIR PCL     BUNIONECTOMY  2011   BUNIONECTOMY  2011   CLOSED REDUCTION SHOULDER DISLOCATION     left reduced under anesthesia   hammer toe repair  2011   REFRACTIVE SURGERY  2009   right shoulder      SHOULDER SURGERY     right   TENDON REPAIR  2011   VASECTOMY     VASECTOMY     WISDOM TOOTH EXTRACTION  2011   Social History   Tobacco Use   Smoking status: Former    Types: Cigars   Smokeless tobacco: Never   Tobacco comments:    only smokes once yearly  Vaping Use   Vaping status: Never Used  Substance Use Topics   Alcohol use: Not Currently    Alcohol/week: 0.0 standard drinks of alcohol    Comment: weekends 4 beers   Drug use: No   Family History  Problem Relation Age of Onset   Cancer Mother        ? pancreatic   Stroke Mother    Vision loss Mother    Cancer Father        lung ca   Hypertension Father    Vision loss Father    Diverticulitis Sister    Prostate cancer Brother    Alcohol abuse Brother    Cancer Brother    Diverticulitis Brother        x2 brothers   Heart disease Maternal Grandfather    Alcohol abuse Brother    Diverticulitis Brother    Cancer Paternal Aunt    Cancer Paternal Uncle    No Known Allergies No current outpatient medications on file prior to visit.   No current facility-administered medications on file prior to visit.    Review of Systems  Constitutional:  Negative for activity change, appetite change,  fatigue, fever and unexpected weight change.  HENT:  Negative for congestion, rhinorrhea, sore throat and trouble swallowing.   Eyes:  Negative for pain, redness, itching and visual disturbance.  Respiratory:  Negative for cough, chest tightness, shortness of breath and wheezing.   Cardiovascular:  Negative for chest pain and palpitations.  Gastrointestinal:  Negative for abdominal pain, blood in stool, constipation, diarrhea and nausea.  Endocrine: Negative for cold intolerance, heat intolerance, polydipsia and polyuria.  Genitourinary:  Negative for difficulty urinating, dysuria, frequency and urgency.       ED  Musculoskeletal:  Negative for arthralgias, joint swelling and myalgias.  Skin:  Negative for pallor and rash.  Neurological:  Negative for dizziness, tremors, weakness, numbness and headaches.  Hematological:  Negative for adenopathy. Does not bruise/bleed easily.  Psychiatric/Behavioral:  Negative for decreased concentration and dysphoric mood. The patient is not nervous/anxious.        Objective:   Physical Exam Constitutional:      General: He is not in acute distress.    Appearance: Normal appearance. He is well-developed.  He is obese. He is not ill-appearing or diaphoretic.  HENT:     Head: Normocephalic and atraumatic.     Right Ear: Tympanic membrane, ear canal and external ear normal.     Left Ear: Tympanic membrane, ear canal and external ear normal.     Nose: Nose normal. No congestion.     Mouth/Throat:     Mouth: Mucous membranes are moist.     Pharynx: Oropharynx is clear. No posterior oropharyngeal erythema.  Eyes:     General: No scleral icterus.       Right eye: No discharge.        Left eye: No discharge.     Conjunctiva/sclera: Conjunctivae normal.     Pupils: Pupils are equal, round, and reactive to light.  Neck:     Thyroid: No thyromegaly.     Vascular: No carotid bruit or JVD.  Cardiovascular:     Rate and Rhythm: Normal rate and regular  rhythm.     Pulses: Normal pulses.     Heart sounds: Normal heart sounds.     No gallop.  Pulmonary:     Effort: Pulmonary effort is normal. No respiratory distress.     Breath sounds: Normal breath sounds. No wheezing or rales.     Comments: Good air exch Chest:     Chest wall: No tenderness.  Abdominal:     General: Bowel sounds are normal. There is no distension or abdominal bruit.     Palpations: Abdomen is soft. There is no mass.     Tenderness: There is no abdominal tenderness.     Hernia: No hernia is present.  Musculoskeletal:        General: No tenderness.     Cervical back: Normal range of motion and neck supple. No rigidity. No muscular tenderness.     Right lower leg: No edema.     Left lower leg: No edema.  Lymphadenopathy:     Cervical: No cervical adenopathy.  Skin:    General: Skin is warm and dry.     Coloration: Skin is not pale.     Findings: No erythema or rash.     Comments: Solar lentigines diffusely   Neurological:     Mental Status: He is alert.     Cranial Nerves: No cranial nerve deficit.     Motor: No abnormal muscle tone.     Coordination: Coordination normal.     Gait: Gait normal.     Deep Tendon Reflexes: Reflexes are normal and symmetric. Reflexes normal.  Psychiatric:        Mood and Affect: Mood normal.        Cognition and Memory: Cognition and memory normal.           Assessment & Plan:   Problem List Items Addressed This Visit       Cardiovascular and Mediastinum   PAF (paroxysmal atrial fibrillation) (HCC)    None since he had covid  No medications         Digestive   Fatty liver    Lab Results  Component Value Date   ALT 28 06/11/2023   AST 22 06/11/2023   ALKPHOS 54 06/11/2023   BILITOT 0.6 06/11/2023   Working on [employer] loss        Other   Welcome to [employer] preventive visit - Primary    Reviewed health habits including diet and exercise and skin cancer prevention Reviewed appropriate screening tests  for age  Also  reviewed health mt list, fam hx and immunization status , as well as social and family history   See HPI Labs reviewed and ordered Encouraged flu shot in the fall  Declines covid vaccine  Will inquire re: tetanus shot at pharmacy  Considering shingrix vaccine  Prevnar 20 given today  Discussed bone health and need for exercise  Colonoscopy utd 07/2015 Psa is stable Given materials to work on advance directive Hearing and vision screens are reassuring  Utd oph care Encouraged dermatology screening visit  PHQ score of 0 Excellent functionality/no help needed for ADLs  Reviewed care team  No cognitive concerns at all       Prostate cancer screening    Lab Results  Component Value Date   PSA 0.61 06/11/2023   PSA 0.34 11/15/2021   PSA 0.32 11/02/2020    Brother had prostate cancer Nocturia is unchanged and no flow changes      Obesity    Discussed how this problem influences overall health and the risks it imposes  Reviewed plan for weight loss with lower calorie diet (via better food choices and also portion control or program like weight watchers) and exercise building up to or more than 30 minutes 5 days per week including some aerobic activity        Hyperlipidemia    Disc goals for lipids and reasons to control them Rev last labs with pt Rev low sat fat diet in detail Labs ordered Diet has improved from last check       Diabetes mellitus screening    Lab Results  Component Value Date   HGBA1C 5.5 06/11/2023   disc imp of low glycemic diet and wt loss to prevent DM2       Other Visit Diagnoses     Need for pneumococcal 20-valent conjugate vaccination       Relevant Orders   Pneumococcal conjugate vaccine 20-valent (Prevnar 20) (Completed)

## 2023-06-20 NOTE — Assessment & Plan Note (Signed)
Lab Results  Component Value Date   HGBA1C 5.5 06/11/2023   disc imp of low glycemic diet and wt loss to prevent DM2

## 2023-06-28 ENCOUNTER — Telehealth: Payer: Self-pay | Admitting: Family Medicine

## 2023-06-28 MED ORDER — SILDENAFIL CITRATE 50 MG PO TABS: 50.0000 mg | ORAL_TABLET | Freq: Every day | ORAL | 0 refills | Status: AC | PRN

## 2023-06-28 NOTE — Telephone Encounter (Signed)
Pt notified Rx sent to pharmacy and advised of Dr. Tower's comments  

## 2023-06-28 NOTE — Telephone Encounter (Signed)
I sent prescription for 10 of the 50 mg to try first   Let me know how it works   If side effects hold it and update [place]

## 2023-06-28 NOTE — Telephone Encounter (Signed)
Patient was advised to call and let Dr Milinda Antis know if he would like to try Viagra.He called in to day to see if he could have a prescription called in for him?  [id number] [place], [place] - [address] Phone: [phone]  Fax: [phone]

## 2023-10-29 ENCOUNTER — Encounter: Payer: Self-pay | Admitting: Family Medicine

## 2023-10-29 ENCOUNTER — Ambulatory Visit (INDEPENDENT_AMBULATORY_CARE_PROVIDER_SITE_OTHER): Payer: Medicare Other | Admitting: Family Medicine

## 2023-10-29 VITALS — BP 118/66 | HR 82 | Temp 98.4°F | Resp 16 | Ht 70.0 in | Wt 239.1 lb

## 2023-10-29 DIAGNOSIS — E66811 Obesity, class 1: Secondary | ICD-10-CM

## 2023-10-29 DIAGNOSIS — I480 Paroxysmal atrial fibrillation: Secondary | ICD-10-CM | POA: Diagnosis not present

## 2023-10-29 DIAGNOSIS — E6609 Other obesity due to excess calories: Secondary | ICD-10-CM | POA: Diagnosis not present

## 2023-10-29 DIAGNOSIS — Z6834 Body mass index (BMI) 34.0-34.9, adult: Secondary | ICD-10-CM | POA: Diagnosis not present

## 2023-10-29 LAB — EKG 12-LEAD

## 2023-10-29 MED ORDER — RIVAROXABAN 20 MG PO TABS
20.0000 mg | ORAL_TABLET | Freq: Every day | ORAL | 3 refills | Status: DC
Start: 2023-10-29 — End: 2024-01-29

## 2023-10-29 NOTE — Assessment & Plan Note (Signed)
 Discussed how this problem influences overall health and the risks it imposes  Reviewed plan for weight loss with lower calorie diet (via better food choices (lower glycemic and portion control) along with exercise building up to or more than 30 minutes 5 days per week including some aerobic activity and strength training

## 2023-10-29 NOTE — Assessment & Plan Note (Signed)
This was caught by visiting nurse from medicare  No symptoms  Rate in 80s Had once before with covid and then converted himself before cardioversion EKG today noted a fib with rate of 82 and no acute ST or T wave changes   Reassuring exam Discussed importance of gradually cutting caffeine  Also better diet/lifestyle habits Labs today for tsh and cbc  Will hold asa and start xarelto 20 mg daily for cva prevention  Handouts given Referral done to cardiology Call back and Er precautions noted in detail today

## 2023-10-29 NOTE — Progress Notes (Signed)
Subjective:    Patient ID: Shane Gonzalez, male    DOB: 1956-03-15, 67 y.o.   MRN: [id number]  HPI  Wt Readings from Last 3 Encounters:  10/29/23 239 lb 2 oz (108.5 kg)  06/20/23 229 lb 8 oz (104.1 kg)  07/27/22 243 lb 2 oz (110.3 kg)   34.31 kg/m  Vitals:   10/29/23 1453 10/29/23 1900  BP: 118/66   Pulse: 99 82  Resp: 16   Temp: 98.4 F (36.9 C)   SpO2: 97%     Pt presents for a fib  Was told he may have PVCs when he had a medicare nurse visit   Has been a little tired  Had a cold and cough- pretty mild /not a severe cough   Has had more caffeine lately after stopping it  Also expresso in the am   No cp No shortness of breath  Feeling like his normal self  Just a little more tired   Does go up and down into a deer stand- made his sciatica worse    History of paroxysmal a fib in past during a bout of covid  Had set up a TEE and cardioversion but then he converted back to sinus rhythm  Took xarelto and metoprolol briefly   This resolved before treatment  In 2021 Saw Dr Scharlene Gloss at [facility]   Lab Results  Component Value Date   NA 139 06/11/2023   K 4.6 06/11/2023   CO2 27 06/11/2023   GLUCOSE 98 06/11/2023   BUN 18 06/11/2023   CREATININE 0.85 06/11/2023   CALCIUM 9.3 06/11/2023   GFR 91.31 06/11/2023   GFRNONAA 81 01/07/2020   Lab Results  Component Value Date   WBC 4.8 06/11/2023   HGB 14.1 06/11/2023   HCT 43.0 06/11/2023   MCV 96.9 06/11/2023   PLT 195.0 06/11/2023   Lab Results  Component Value Date   ALT 28 06/11/2023   AST 22 06/11/2023   ALKPHOS 54 06/11/2023   BILITOT 0.6 06/11/2023    Lab Results  Component Value Date   CHOL 199 06/11/2023   HDL 65.00 06/11/2023   LDLCALC 117 (H) 06/11/2023   LDLDIRECT 143.0 07/09/2013   TRIG 88.0 06/11/2023   CHOLHDL 3 06/11/2023       Patient Active Problem List   Diagnosis Date Noted   Welcome to Medicare preventive visit 06/20/2023   ED (erectile dysfunction) 06/20/2023   Diabetes  mellitus screening 06/01/2023   Chronic cough 11/15/2021   Obesity 06/28/2020   Fatty liver 12/22/2019   PAF (paroxysmal atrial fibrillation) (HCC) 12/15/2019   History of COVID-19 12/12/2019   Family history of prostate cancer 04/04/2013   Routine general medical examination at a health care facility 11/13/2011   Prostate cancer screening 11/13/2011   Hyperlipidemia 08/17/2008   DIVERTICULITIS, HX OF 08/04/2008   Past Medical History:  Diagnosis Date   Bilateral bunions    both feet   Blood in stool    Hx of   Diverticulosis    Hx of   GERD (gastroesophageal reflux disease)    Hyperlipidemia    Toe deformity    2nd   Toenail fungus    rt big toe   Past Surgical History:  Procedure Laterality Date   ANTERIOR CRUCIATE LIGAMENT REPAIR     ARTHROSCOPIC REPAIR ACL     ARTHROSCOPIC REPAIR PCL     BUNIONECTOMY  2011   BUNIONECTOMY  2011   CLOSED REDUCTION SHOULDER DISLOCATION  left reduced under anesthesia   hammer toe repair  2011   REFRACTIVE SURGERY  2009   right shoulder      SHOULDER SURGERY     right   TENDON REPAIR  2011   VASECTOMY     VASECTOMY     WISDOM TOOTH EXTRACTION  2011   Social History   Tobacco Use   Smoking status: Former    Types: Cigars   Smokeless tobacco: Never   Tobacco comments:    only smokes once yearly  Vaping Use   Vaping status: Never Used  Substance Use Topics   Alcohol use: Not Currently    Alcohol/week: 0.0 standard drinks of alcohol    Comment: weekends 4 beers   Drug use: No   Family History  Problem Relation Age of Onset   Cancer Mother        ? pancreatic   Stroke Mother    Vision loss Mother    Cancer Father        lung ca   Hypertension Father    Vision loss Father    Diverticulitis Sister    Prostate cancer Brother    Alcohol abuse Brother    Cancer Brother    Diverticulitis Brother        x2 brothers   Heart disease Maternal Grandfather    Alcohol abuse Brother    Diverticulitis Brother    Cancer  Paternal Aunt    Cancer Paternal Uncle    No Known Allergies Current Outpatient Medications on File Prior to Visit  Medication Sig Dispense Refill   sildenafil (VIAGRA) 50 MG tablet Take 1 tablet (50 mg total) by mouth daily as needed for erectile dysfunction. 10 tablet 0   No current facility-administered medications on file prior to visit.    Review of Systems  Constitutional:  Positive for fatigue. Negative for activity change, appetite change, fever and unexpected weight change.  HENT:  Negative for congestion, rhinorrhea, sore throat and trouble swallowing.   Eyes:  Negative for pain, redness, itching and visual disturbance.  Respiratory:  Negative for cough, chest tightness, shortness of breath and wheezing.   Cardiovascular:  Negative for chest pain and palpitations.  Gastrointestinal:  Negative for abdominal pain, blood in stool, constipation, diarrhea and nausea.  Endocrine: Negative for cold intolerance, heat intolerance, polydipsia and polyuria.  Genitourinary:  Negative for difficulty urinating, dysuria, frequency and urgency.  Musculoskeletal:  Negative for arthralgias, joint swelling and myalgias.  Skin:  Negative for pallor and rash.  Neurological:  Negative for dizziness, tremors, weakness, numbness and headaches.  Hematological:  Negative for adenopathy. Does not bruise/bleed easily.  Psychiatric/Behavioral:  Negative for decreased concentration and dysphoric mood. The patient is not nervous/anxious.        Objective:   Physical Exam Constitutional:      General: He is not in acute distress.    Appearance: Normal appearance. He is well-developed. He is obese. He is not ill-appearing or diaphoretic.  HENT:     Head: Normocephalic and atraumatic.     Mouth/Throat:     Mouth: Mucous membranes are moist.  Eyes:     General: No scleral icterus.    Conjunctiva/sclera: Conjunctivae normal.     Pupils: Pupils are equal, round, and reactive to light.  Neck:      Thyroid: No thyromegaly.     Vascular: No carotid bruit or JVD.  Cardiovascular:     Rate and Rhythm: Normal rate. Rhythm irregular.  Pulses: Normal pulses.     Heart sounds: Normal heart sounds.     No gallop.     Comments: Irregularly irregular rhythm  Pulmonary:     Effort: Pulmonary effort is normal. No respiratory distress.     Breath sounds: Normal breath sounds. No wheezing or rales.  Abdominal:     General: There is no distension or abdominal bruit.     Palpations: Abdomen is soft.  Musculoskeletal:     Cervical back: Normal range of motion and neck supple.     Right lower leg: No edema.     Left lower leg: No edema.  Lymphadenopathy:     Cervical: No cervical adenopathy.  Skin:    General: Skin is warm and dry.     Coloration: Skin is not pale.     Findings: No rash.  Neurological:     Mental Status: He is alert.     Coordination: Coordination normal.     Deep Tendon Reflexes: Reflexes are normal and symmetric. Reflexes normal.  Psychiatric:        Mood and Affect: Mood normal.           Assessment & Plan:   Problem List Items Addressed This Visit       Cardiovascular and Mediastinum   PAF (paroxysmal atrial fibrillation) (HCC) - Primary    This was caught by visiting nurse from medicare  No symptoms  Rate in 80s Had once before with covid and then converted himself before cardioversion EKG today noted a fib with rate of 82 and no acute ST or T wave changes   Reassuring exam Discussed importance of gradually cutting caffeine  Also better diet/lifestyle habits Labs today for tsh and cbc  Will hold asa and start xarelto 20 mg daily for cva prevention  Handouts given Referral done to cardiology Call back and Er precautions noted in detail today        Relevant Medications   rivaroxaban (XARELTO) 20 MG TABS tablet   Other Relevant Orders   EKG 12-Lead (Completed)   TSH   CBC with Differential/Platelet   Basic metabolic panel   Ambulatory  referral to Cardiology     Other   Obesity    Discussed how this problem influences overall health and the risks it imposes  Reviewed plan for weight loss with lower calorie diet (via better food choices (lower glycemic and portion control) along with exercise building up to or more than 30 minutes 5 days per week including some aerobic activity and strength training

## 2023-10-29 NOTE — Patient Instructions (Addendum)
Cut back on caffeine gradually   Take care of yourself   Stop the aspirin   Start xarelto  This is to prevent stroke with a fib   I put the referral in for cardiology  Please let us know if you don't hear in 1-2 weeks   If you get  Chest pain or pressure Shortness of breath  Any other other new symptoms let us know    If you ever get symptoms of a stroke call 911 Facial droop, trouble speaking, weakness or numbness

## 2023-10-30 LAB — CBC WITH DIFFERENTIAL/PLATELET
Basophils Absolute: 0 10*3/uL (ref 0.0–0.1)
Basophils Relative: 1 % (ref 0.0–3.0)
Eosinophils Absolute: 0.1 10*3/uL (ref 0.0–0.7)
Eosinophils Relative: 2.2 % (ref 0.0–5.0)
HCT: 43.8 % (ref 39.0–52.0)
Hemoglobin: 14.9 g/dL (ref 13.0–17.0)
Lymphocytes Relative: 28.5 % (ref 12.0–46.0)
Lymphs Abs: 1.4 10*3/uL (ref 0.7–4.0)
MCHC: 34.1 g/dL (ref 30.0–36.0)
MCV: 96.8 fL (ref 78.0–100.0)
Monocytes Absolute: 0.5 10*3/uL (ref 0.1–1.0)
Monocytes Relative: 9.7 % (ref 3.0–12.0)
Neutro Abs: 2.8 10*3/uL (ref 1.4–7.7)
Neutrophils Relative %: 58.6 % (ref 43.0–77.0)
Platelets: 250 10*3/uL (ref 150.0–400.0)
RBC: 4.52 Mil/uL (ref 4.22–5.81)
RDW: 13.1 % (ref 11.5–15.5)
WBC: 4.7 10*3/uL (ref 4.0–10.5)

## 2023-10-30 LAB — TSH: TSH: 1.51 u[IU]/mL (ref 0.35–5.50)

## 2023-10-30 LAB — BASIC METABOLIC PANEL
BUN: 21 mg/dL (ref 6–23)
CO2: 27 meq/L (ref 19–32)
Calcium: 9.8 mg/dL (ref 8.4–10.5)
Chloride: 103 meq/L (ref 96–112)
Creatinine, Ser: 1 mg/dL (ref 0.40–1.50)
GFR: 78.87 mL/min (ref >60.00)
Glucose, Bld: 75 mg/dL (ref 70–99)
Potassium: 4.9 meq/L (ref 3.5–5.1)
Sodium: 139 meq/L (ref 135–145)

## 2023-10-31 ENCOUNTER — Encounter: Payer: Self-pay | Admitting: *Deleted

## 2023-11-01 ENCOUNTER — Encounter: Payer: Self-pay | Admitting: Family Medicine

## 2023-11-02 NOTE — Telephone Encounter (Signed)
Do you need new referral?

## 2023-11-12 ENCOUNTER — Ambulatory Visit: Payer: Medicare Other | Attending: Nurse Practitioner | Admitting: Nurse Practitioner

## 2023-11-12 ENCOUNTER — Encounter: Payer: Self-pay | Admitting: Nurse Practitioner

## 2023-11-12 VITALS — BP 118/78 | HR 84 | Ht 70.0 in | Wt 244.2 lb

## 2023-11-12 DIAGNOSIS — G4730 Sleep apnea, unspecified: Secondary | ICD-10-CM | POA: Diagnosis not present

## 2023-11-12 DIAGNOSIS — R072 Precordial pain: Secondary | ICD-10-CM

## 2023-11-12 DIAGNOSIS — I4819 Other persistent atrial fibrillation: Secondary | ICD-10-CM | POA: Diagnosis not present

## 2023-11-12 DIAGNOSIS — E782 Mixed hyperlipidemia: Secondary | ICD-10-CM

## 2023-11-12 MED ORDER — METOPROLOL SUCCINATE ER 25 MG PO TB24: 12.5000 mg | ORAL_TABLET | Freq: Every day | ORAL | 3 refills | Status: DC

## 2023-11-12 NOTE — Progress Notes (Signed)
Cardiology Clinic Note   Patient Name: Shane Gonzalez Date of Encounter: 11/12/2023  Primary Care Provider:  Judy Pimple, MD Primary Cardiologist:  Reatha Harps, MD  Patient Profile    67 y.o. male with a history of paroxysmal atrial fibrillation in the setting of COVID-pneumonia in 2021, obesity, hyperlipidemia, erectile dysfunction, and GERD, who presents to reestablish care in the setting of recurrent atrial fibrillation.  Past Medical History    Past Medical History:  Diagnosis Date   Aortic valve sclerosis    a. 12/2019 Echo: EF of 60 to 65% with moderate LVH, no wall motion abnormalities, normal RV size and function, trivial MR, and mild to moderate aortic sclerosis without stenosis   Bilateral bunions    both feet   Blood in stool    Hx of   Diverticulosis    Hx of   Erectile dysfunction    GERD (gastroesophageal reflux disease)    Hyperlipidemia    PAF (paroxysmal atrial fibrillation) (HCC)    a. 12/2019 Dx in setting of COVID-->placed on Xarelto-->converted on his own prior to DCCV and Xarelto d/c'd; b. 10/2023 Incidentally noted to be back in Afib-->Xarelto (CHA2DS2VASc = 1).   Toe deformity    2nd   Toenail fungus    rt big toe   Past Surgical History:  Procedure Laterality Date   ANTERIOR CRUCIATE LIGAMENT REPAIR     ARTHROSCOPIC REPAIR ACL     ARTHROSCOPIC REPAIR PCL     BUNIONECTOMY  2011   BUNIONECTOMY  2011   CLOSED REDUCTION SHOULDER DISLOCATION     left reduced under anesthesia   hammer toe repair  2011   REFRACTIVE SURGERY  2009   right shoulder      SHOULDER SURGERY     right   TENDON REPAIR  2011   VASECTOMY     VASECTOMY     WISDOM TOOTH EXTRACTION  2011    Allergies  No Known Allergies  History of Present Illness      67 y.o. y/o male with a history of paroxysmal atrial fibrillation in the setting of COVID-pneumonia in 2021, obesity, hyperlipidemia, erectile dysfunction, and GERD.  In January 2021, he was admitted for  COVID-pneumonia.  CT of the chest showed no PE but did show bilateral pneumonia.  During his hospitalization, he developed atrial fibrillation.  He was not anticoagulated in the setting of a CHA2DS2-VASc of 0.  Echocardiogram showed an EF of 60 to 65% with moderate LVH, no wall motion abnormalities, normal RV size and function, trivial MR, and mild to moderate aortic sclerosis without stenosis.  He was discharged home on beta-blocker therapy.  He followed up with Dr. Flora Lipps in [place] in January 2021, at which time he remained in atrial fibrillation.  He was placed on Xarelto and scheduled for cardioversion 3 weeks later but upon presentation for cardioversion, he was noted to be in sinus rhythm.    Mr. Saucer was last seen in cardiology clinic in March 2021, at which time he was maintaining sinus rhythm and Xarelto was discontinued in the setting of CHA2DS2-VASc of 0.  Mr. Liang was recently evaluated by Medicare home health nurse and was noted to be irregular on examination.  There was concern for atrial fibrillation, and he was seen by his primary care provider November 25, with ECG showing A-fib at 82 bpm without acute ST or T changes.  Xarelto 20 mg daily was initiated in the setting of CHA2DS2-VASc of 1  and he was referred to cardiology.  Upon discussing it today, Mr. Czapla has had some fatigue and perhaps mild dyspnea on exertion and intermittent 1/10 left-sided chest pressure over the past 3 to 4 weeks.  He has been hunting almost every day for the past 2 weeks and generally does fine but he says that his daughter noted that he is a little more short of breath, he is less sure.  He denies palpitations, PND, orthopnea, dizziness, syncope, edema, or early satiety.  He does snore and has noted, he does have some daytime fatigue and sleepiness.  He has never had a sleep study.  He has a history of drinking beer on the weekend, up to 8-10 beers for the entire weekend, though has not done this in a few  weeks. Objective  Home Medications    Current Outpatient Medications  Medication Sig Dispense Refill   metoprolol succinate (TOPROL XL) 25 MG 24 hr tablet Take 0.5 tablets (12.5 mg total) by mouth daily. 45 tablet 3   rivaroxaban (XARELTO) 20 MG TABS tablet Take 1 tablet (20 mg total) by mouth daily with supper. 30 tablet 3   sildenafil (VIAGRA) 50 MG tablet Take 1 tablet (50 mg total) by mouth daily as needed for erectile dysfunction. (Patient not taking: Reported on 11/12/2023) 10 tablet 0   No current facility-administered medications for this visit.     Family History    Family History  Problem Relation Age of Onset   Cancer Mother        ? pancreatic   Stroke Mother    Vision loss Mother    Cancer Father        lung ca   Hypertension Father    Vision loss Father    Diverticulitis Sister    Prostate cancer Brother    Alcohol abuse Brother    Cancer Brother    Diverticulitis Brother        x2 brothers   Heart disease Maternal Grandfather    Alcohol abuse Brother    Diverticulitis Brother    Cancer Paternal Aunt    Cancer Paternal Uncle    He indicated that his mother is deceased. He indicated that his father is deceased. He indicated that his sister is alive. He indicated that all of his five brothers are alive. He indicated that his maternal grandmother is deceased. He indicated that his maternal grandfather is deceased. He indicated that his paternal grandmother is deceased. He indicated that his paternal grandfather is deceased. He indicated that the status of his paternal aunt is unknown. He indicated that the status of his paternal uncle is unknown.   Social History    Social History   Socioeconomic History   Marital status: Married    Spouse name: Not on file   Number of children: 2   Years of education: Not on file   Highest education level: Not on file  Occupational History   Occupation: Sales Rep  Tobacco Use   Smoking status: Former    Types: Cigars    Smokeless tobacco: Never   Tobacco comments:    only smokes once yearly  Vaping Use   Vaping status: Never Used  Substance and Sexual Activity   Alcohol use: Yes    Alcohol/week: 8.0 standard drinks of alcohol    Types: 8 Cans of beer per week    Comment: 8-10 beers on the weekends   Drug use: No   Sexual activity: Yes    Birth  control/protection: Surgical  Other Topics Concern   Not on file  Social History Narrative   Tobacco use, amount per day now: None.   Past tobacco use, amount per day:   How many years did you use tobacco:   Alcohol use (drinks per week): 4 occasionally.    Diet:   Do you drink/eat things with caffeine: Tea   Marital status: Married                                   What year were you married? 1985   Do you live in a house, apartment, assisted living, condo, trailer, etc.? House.   Is it one or more stories? One   How many persons live in your home? 2   Do you have pets in your home?( please list) 1 Dog,1 Cat , and 8 Chickens.   Highest Level of education completed? GED   Current or past profession: Data processing manager.    Do you exercise? Yes                                 Type and how often? Walk, play golf, lift weights, and stationary bike.    Do you have a living will? No   Do you have a DNR form? No                                  If not, do you want to discuss one?   Do you have signed POA/HPOA forms? No                       If so, please bring to you appointment      Do you have any difficulty bathing or dressing yourself? No   Do you have any difficulty preparing food or eating? No   Do you have any difficulty managing your medications? No   Do you have any difficulty managing your finances? No   Do you have any difficulty affording your medications? No   Social Determinants of Corporate investment banker Strain: Not on file  Food Insecurity: Not on file  Transportation Needs: Not on file  Physical Activity: Not on file  Stress:  Not on file  Social Connections: Not on file  Intimate Partner Violence: Not on file     Review of Systems    General:  +++ fatigue, No chills, fever, night sweats or weight changes.  Cardiovascular: Occasional, very mild, 1/10 chest pressure, has had some dyspnea on exertion, edema, orthopnea, palpitations, paroxysmal nocturnal dyspnea. Dermatological: No rash, lesions/masses Respiratory: +++ cough w/ some congestion, +++ dyspnea Urologic: No hematuria, dysuria Abdominal:   No nausea, vomiting, diarrhea, bright red blood per rectum, melena, or hematemesis Neurologic:  No visual changes, wkns, changes in mental status. All other systems reviewed and are otherwise negative except as noted above.     Physical Exam    VS:  BP 118/78 (BP Location: Left Arm, Patient Position: Sitting, Cuff Size: Normal)   Pulse 84   Ht 5\' 10"  (1.778 m)   Wt 244 lb 3.2 oz (110.8 kg)   SpO2 97%   BMI 35.04 kg/m  , BMI Body mass index is 35.04 kg/m. STOP-Bang Score:  5  GEN: Well nourished, well developed, in no acute distress. HEENT: normal. Neck: Supple, no JVD, carotid bruits, or masses. Cardiac: IR, IR, no murmurs, rubs, or gallops. No clubbing, cyanosis, edema.  Radials 2+/PT 2+ and equal bilaterally.  Respiratory:  Respirations regular and unlabored, clear to auscultation bilaterally. GI: Soft, nontender, nondistended, BS + x 4. MS: no deformity or atrophy. Skin: warm and dry, no rash. Neuro:  Strength and sensation are intact. Psych: Normal affect.  Accessory Clinical Findings    ECG personally reviewed by me today- EKG Interpretation Date/Time:  Monday November 12 2023 08:52:57 EST Ventricular Rate:  84 PR Interval:    QRS Duration:  94 QT Interval:  376 QTC Calculation: 444 R Axis:   19  Text Interpretation: Atrial fibrillation Septal infarct , age undetermined Confirmed by Nicolasa Ducking ([id number]) on 11/12/2023 9:06:51 AM  - No acute changes  Lab Results  Component Value  Date   WBC 4.7 10/29/2023   HGB 14.9 10/29/2023   HCT 43.8 10/29/2023   MCV 96.8 10/29/2023   PLT 250.0 10/29/2023   Lab Results  Component Value Date   CREATININE 1.00 10/29/2023   BUN 21 10/29/2023   NA 139 10/29/2023   K 4.9 10/29/2023   CL 103 10/29/2023   CO2 27 10/29/2023   Lab Results  Component Value Date   ALT 28 06/11/2023   AST 22 06/11/2023   ALKPHOS 54 06/11/2023   BILITOT 0.6 06/11/2023   Lab Results  Component Value Date   CHOL 199 06/11/2023   HDL 65.00 06/11/2023   LDLCALC 117 (H) 06/11/2023   LDLDIRECT 143.0 07/09/2013   TRIG 88.0 06/11/2023   CHOLHDL 3 06/11/2023    Lab Results  Component Value Date   HGBA1C 5.5 06/11/2023      Assessment & Plan   1.  Persistent atrial fibrillation: Patient with a history of A-fib in January 2021 in the setting of COVID infection which subsequently converted on beta-blocker therapy in the outpatient setting.  Echo at that time showed aortic sclerosis but was otherwise normal.  Recently noted to have an irregular heart rhythm by a home health nurse and was found to be in A-fib by ECG on November 25.  He was placed on Xarelto at that time.  Lab work unremarkable.  In retrospect, patient thinks he may be more fatigued over the past 3 to 4 weeks, with some dyspnea on exertion and occasional, very mild, 1/10 left-sided chest pressure, which she attributes to being in atrial fibrillation.  He is asymptomatic at rest.  Resting heart rate today is 84.  I am adding low-dose Toprol-XL 12.5 mg daily.  Continue Xarelto 20 mg daily setting of CHA2DS2VASc of 1.  I will arrange for 2D echocardiogram to reevaluate LV function and chamber sizes.  Referral for sleep study in the setting of a STOP-BANG of 5.  Follow-up in clinic in 3 weeks with plan for cardioversion if he is still in A-fib at that time.  Will also refer to electrophysiology for long-term management planning.  2.  Precordial chest pressure: Patient has trouble describing it  but thinks he has a pressure-like sensation at times with activity over the past month or so.  He is not sure if he is feeling irregularity/palpitations.  He has had fatigue and perhaps some dyspnea on exertion as well.  We discussed ischemic evaluation today and mutually agreed to proceed with echocardiogram as outlined above, and restoration of sinus rhythm with reevaluation of symptoms afterward.  Presuming LV function is normal on echo, once we restore sinus rhythm, we can consider coronary CT angiogram.  3.  Sleep disordered breathing: Patient notes a history of loud snoring as well as daytime fatigue.  STOP-BANG is 5.  In the setting of this, obesity, and development of atrial fibrillation, will refer to pulmonology for sleep evaluation.  4.  Hyperlipidemia: LDL of 117.  Coronary calcifications and aortic atherosclerosis previously noted on CT scan in January 2021.  He is somewhat medicine averse and as we are adding low-dose beta-blocker and Xarelto was recently started, we did not discuss statin therapy today, but this will need to be on our radar in the future.  5.  Morbid obesity: BMI of 35.  Discussed the impact of obesity on the development of atrial fibrillation.  He has identified that he would like to get down to 200 pounds and was previously exercising.  Will need to have ongoing discussions regarding appropriate exercise and caloric restriction.  6.  Alcohol use: Says when he was younger he drank quite a bit but on weekends now, he might have 4-5 beers each day.  I advised that there is no safe level of alcohol consumption and that especially in the setting of development of atrial fibrillation, he should cease imbibing.  7.  Disposition: Follow-up echocardiogram.  Referral to pulmonology for sleep eval.  Follow-up in clinic in 3 weeks.  Referral to A-fib specialists.  Nicolasa Ducking, NP 11/12/2023, 9:48 AM

## 2023-11-12 NOTE — Patient Instructions (Addendum)
Medication Instructions:   START: metoprolol succinate (TOPROL XL) 25 MG 24 hr tablet - Take 0.5 tablets (12.5 mg total) by mouth daily  *If you need a refill on your cardiac medications before your next appointment, please call your pharmacy*  Lab Work: - None ordered  Testing/Procedures: Your physician has requested that you have an echocardiogram. Echocardiography is a painless test that uses sound waves to create images of your heart. It provides your doctor with information about the size and shape of your heart and how well your heart's chambers and valves are working. This procedure takes approximately one hour. There are no restrictions for this procedure. Please do NOT wear cologne, perfume, aftershave, or lotions (deodorant is allowed). Please arrive 15 minutes prior to your appointment time.  Please note: We ask at that you not bring children with you during ultrasound (echo/ vascular) testing. Due to room size and safety concerns, children are not allowed in the ultrasound rooms during exams. Our front office staff cannot provide observation of children in our lobby area while testing is being conducted. An adult accompanying a patient to their appointment will only be allowed in the ultrasound room at the discretion of the ultrasound technician under special circumstances. We apologize for any inconvenience.   Follow-Up: At [facility], you and your health needs are our priority.  As part of our continuing mission to provide you with exceptional heart care, we have created designated Provider Care Teams.  These Care Teams include your primary Cardiologist (physician) and Advanced Practice Providers (APPs -  Physician Assistants and Nurse Practitioners) who all work together to provide you with the care you need, when you need it.  Your next appointment:   3 week(s)  Provider:   Nicolasa Ducking, NP    Other Instructions - Ambulatory referral to Pulmonology -  Ambulatory referral to Cardiac Electrophysiology

## 2023-12-04 ENCOUNTER — Ambulatory Visit: Payer: Medicare Other | Attending: Nurse Practitioner

## 2023-12-04 DIAGNOSIS — I4819 Other persistent atrial fibrillation: Secondary | ICD-10-CM | POA: Diagnosis not present

## 2023-12-04 LAB — ECHOCARDIOGRAM COMPLETE: S' Lateral: 3.1 cm

## 2023-12-04 MED ORDER — PERFLUTREN LIPID MICROSPHERE
1.0000 mL | INTRAVENOUS | Status: AC | PRN
  Administered 2023-12-04: 3 mL via INTRAVENOUS

## 2023-12-17 NOTE — H&P (View-Only) (Signed)
 Electrophysiology Office Note:   Date:  12/18/2023  ID:  Shane Gonzalez, DOB Jul 02, 1956, MRN [id number]  Primary Cardiologist: Darryle ONEIDA Decent, MD Primary Heart Failure: None Electrophysiologist: Fonda Kitty, MD      History of Present Illness:   Shane Gonzalez is a 68 y.o. male with h/o persistent atrial fibrillation, hyperlipidemia, and GERD who is being seen today for evaluation of atrial fibrillation at the request of Lonni Meager, NP.  Patient was first diagnosed with atrial fibrillation related to a COVID infection in 2021.  He did well for many years and then was found to have an irregular heart rate by home health nurse in late 2024.  He saw his primary care provider on October 29, 2023 and EKG was done showing atrial fibrillation.  He was started on Xarelto  20 mg daily and referred to cardiology.  Since having atrial fibrillation he thinks that he is more short of breath and fatigued than usual. Otherwise doing well. No new or acute complaints today.  Review of systems complete and found to be negative unless listed in HPI.   EP Information / Studies Reviewed:    EKG is ordered today. Personal review as below.  EKG Interpretation Date/Time:  Tuesday December 18 2023 07:55:09 EST Ventricular Rate:  73 PR Interval:    QRS Duration:  90 QT Interval:  394 QTC Calculation: 434 R Axis:   16  Text Interpretation: Atrial fibrillation Septal infarct (cited on or before 12-Nov-2023) When compared with ECG of 12-Nov-2023 08:52, No significant change was found Confirmed by Kitty Fonda [phone]) on 12/18/2023 8:02:04 AM   EKG 11/12/23: AF   Echo 12/03/24: Normal LV size and function.  LVEF 60 to 65%. Normal RV size and function. Mild MR.  No other significant valvular disease. Left atrium normal in size.  Right atrium normal in size.  Risk Assessment/Calculations:    CHA2DS2-VASc Score = 1   This indicates a 0.6% annual risk of stroke. The patient's score is based upon: CHF  History: 0 HTN History: 0 Diabetes History: 0 Stroke History: 0 Vascular Disease History: 0 Age Score: 1 Gender Score: 0         STOP-Bang Score:  5       Physical Exam:   VS:  BP 111/74   Pulse 73   Ht 5' 10 (1.778 m)   Wt 247 lb (112 kg)   SpO2 98%   BMI 35.44 kg/m    Wt Readings from Last 3 Encounters:  12/18/23 247 lb (112 kg)  11/12/23 244 lb 3.2 oz (110.8 kg)  10/29/23 239 lb 2 oz (108.5 kg)     GEN: Well nourished, well developed in no acute distress NECK: No JVD CARDIAC: Normal rate, irregular rhythm. RESPIRATORY:  Clear to auscultation without rales, wheezing or rhonchi  ABDOMEN: Soft, non-tender, non-distended EXTREMITIES:  No edema; No deformity   ASSESSMENT AND PLAN:   Shane Gonzalez is a 68 y.o. male with h/o persistent atrial fibrillation, hyperlipidemia, and GERD who is being seen today for evaluation of atrial fibrillation at the request of Lonni Meager, NP.  Patient diagnosed with paroxysmal atrial fibrillation related to COVID infection in 2021.  It appears that now he has progressed to persistent atrial fibrillation.  #Symptomatic persistent atrial fibrillation: -Start dronedarone  400mg  BID as a bridge to ablation. Continue low dose metoprolol . -He has been on Xarelto  for >1 month with no interruption. Cardioversion this week.  -Discussed treatment options today for AF including antiarrhythmic drug therapy and ablation.  Discussed risks, recovery and likelihood of success with each treatment strategy. Risk, benefits, and alternatives to EP study and ablation for afib were discussed. These risks include but are not limited to stroke, bleeding, vascular damage, tamponade, perforation, damage to the esophagus, lungs, phrenic nerve and other structures, pulmonary vein stenosis, worsening renal function, coronary vasospasm and death.  Discussed potential need for repeat ablation procedures and antiarrhythmic drugs after an initial ablation. The patient  understands these risk and wishes to proceed.  We will therefore proceed with catheter ablation at the next available time.  Carto, ICE, anesthesia are requested for the procedure.  Will also obtain CT PV protocol prior to the procedure to exclude LAA thrombus and further evaluate atrial anatomy.   #Secondary hypercoagulable state due to atrial fibrillation:  -CHA2DS2-VASc score of 1. -Continue Xarelto in preparation for cardioversion.  He will need to remain on this for 1 month post cardioversion.  #Sleep disordered breathing: Patient endorses loud snoring and has a STOP-BANG score of 5. -Sleep study has been ordered.   Signed, Nobie Putnam, MD

## 2023-12-17 NOTE — Progress Notes (Signed)
 Electrophysiology Office Note:   Date:  12/18/2023  ID:  Shane Gonzalez, DOB Jul 02, 1956, MRN [id number]  Primary Cardiologist: Darryle ONEIDA Decent, MD Primary Heart Failure: None Electrophysiologist: Fonda Kitty, MD      History of Present Illness:   Shane Gonzalez is a 68 y.o. male with h/o persistent atrial fibrillation, hyperlipidemia, and GERD who is being seen today for evaluation of atrial fibrillation at the request of Lonni Meager, NP.  Patient was first diagnosed with atrial fibrillation related to a COVID infection in 2021.  He did well for many years and then was found to have an irregular heart rate by home health nurse in late 2024.  He saw his primary care provider on October 29, 2023 and EKG was done showing atrial fibrillation.  He was started on Xarelto  20 mg daily and referred to cardiology.  Since having atrial fibrillation he thinks that he is more short of breath and fatigued than usual. Otherwise doing well. No new or acute complaints today.  Review of systems complete and found to be negative unless listed in HPI.   EP Information / Studies Reviewed:    EKG is ordered today. Personal review as below.  EKG Interpretation Date/Time:  Tuesday December 18 2023 07:55:09 EST Ventricular Rate:  73 PR Interval:    QRS Duration:  90 QT Interval:  394 QTC Calculation: 434 R Axis:   16  Text Interpretation: Atrial fibrillation Septal infarct (cited on or before 12-Nov-2023) When compared with ECG of 12-Nov-2023 08:52, No significant change was found Confirmed by Kitty Fonda [phone]) on 12/18/2023 8:02:04 AM   EKG 11/12/23: AF   Echo 12/03/24: Normal LV size and function.  LVEF 60 to 65%. Normal RV size and function. Mild MR.  No other significant valvular disease. Left atrium normal in size.  Right atrium normal in size.  Risk Assessment/Calculations:    CHA2DS2-VASc Score = 1   This indicates a 0.6% annual risk of stroke. The patient's score is based upon: CHF  History: 0 HTN History: 0 Diabetes History: 0 Stroke History: 0 Vascular Disease History: 0 Age Score: 1 Gender Score: 0         STOP-Bang Score:  5       Physical Exam:   VS:  BP 111/74   Pulse 73   Ht 5' 10 (1.778 m)   Wt 247 lb (112 kg)   SpO2 98%   BMI 35.44 kg/m    Wt Readings from Last 3 Encounters:  12/18/23 247 lb (112 kg)  11/12/23 244 lb 3.2 oz (110.8 kg)  10/29/23 239 lb 2 oz (108.5 kg)     GEN: Well nourished, well developed in no acute distress NECK: No JVD CARDIAC: Normal rate, irregular rhythm. RESPIRATORY:  Clear to auscultation without rales, wheezing or rhonchi  ABDOMEN: Soft, non-tender, non-distended EXTREMITIES:  No edema; No deformity   ASSESSMENT AND PLAN:   Shane Gonzalez is a 68 y.o. male with h/o persistent atrial fibrillation, hyperlipidemia, and GERD who is being seen today for evaluation of atrial fibrillation at the request of Lonni Meager, NP.  Patient diagnosed with paroxysmal atrial fibrillation related to COVID infection in 2021.  It appears that now he has progressed to persistent atrial fibrillation.  #Symptomatic persistent atrial fibrillation: -Start dronedarone  400mg  BID as a bridge to ablation. Continue low dose metoprolol . -He has been on Xarelto  for >1 month with no interruption. Cardioversion this week.  -Discussed treatment options today for AF including antiarrhythmic drug therapy and ablation.  Discussed risks, recovery and likelihood of success with each treatment strategy. Risk, benefits, and alternatives to EP study and ablation for afib were discussed. These risks include but are not limited to stroke, bleeding, vascular damage, tamponade, perforation, damage to the esophagus, lungs, phrenic nerve and other structures, pulmonary vein stenosis, worsening renal function, coronary vasospasm and death.  Discussed potential need for repeat ablation procedures and antiarrhythmic drugs after an initial ablation. The patient  understands these risk and wishes to proceed.  We will therefore proceed with catheter ablation at the next available time.  Carto, ICE, anesthesia are requested for the procedure.  Will also obtain CT PV protocol prior to the procedure to exclude LAA thrombus and further evaluate atrial anatomy.   #Secondary hypercoagulable state due to atrial fibrillation:  -CHA2DS2-VASc score of 1. -Continue Xarelto  in preparation for cardioversion.  He will need to remain on this for 1 month post cardioversion.  #Sleep disordered breathing: Patient endorses loud snoring and has a STOP-BANG score of 5. -Sleep study has been ordered.   Signed, Fonda Kitty, MD

## 2023-12-17 NOTE — H&P (View-Only) (Signed)
 Electrophysiology Office Note:   Date:  12/18/2023  ID:  Shane Gonzalez, DOB Jul 18, 1956, MRN [id number]  Primary Cardiologist: Reatha Harps, MD Primary Heart Failure: None Electrophysiologist: Nobie Putnam, MD      History of Present Illness:   Shane Gonzalez is a 68 y.o. male with h/o persistent atrial fibrillation, hyperlipidemia, and GERD who is being seen today for evaluation of atrial fibrillation at the request of Nicolasa Ducking, NP.  Patient was first diagnosed with atrial fibrillation related to a COVID infection in 2021.  He did well for many years and then was found to have an irregular heart rate by home health nurse in late 2024.  He saw his primary care provider on October 29, 2023 and EKG was done showing atrial fibrillation.  He was started on Xarelto 20 mg daily and referred to cardiology.  Since having atrial fibrillation he thinks that he is more short of breath and fatigued than usual. Otherwise doing well. No new or acute complaints today.  Review of systems complete and found to be negative unless listed in HPI.   EP Information / Studies Reviewed:    EKG is ordered today. Personal review as below.  EKG Interpretation Date/Time:  Tuesday December 18 2023 07:55:09 EST Ventricular Rate:  73 PR Interval:    QRS Duration:  90 QT Interval:  394 QTC Calculation: 434 R Axis:   16  Text Interpretation: Atrial fibrillation Septal infarct (cited on or before 12-Nov-2023) When compared with ECG of 12-Nov-2023 08:52, No significant change was found Confirmed by Nobie Putnam [phone] on 12/18/2023 8:02:04 AM   EKG 11/12/23: AF   Echo 12/03/24: Normal LV size and function.  LVEF 60 to 65%. Normal RV size and function. Mild MR.  No other significant valvular disease. Left atrium normal in size.  Right atrium normal in size.  Risk Assessment/Calculations:    CHA2DS2-VASc Score = 1   This indicates a 0.6% annual risk of stroke. The patient's score is based upon: CHF  History: 0 HTN History: 0 Diabetes History: 0 Stroke History: 0 Vascular Disease History: 0 Age Score: 1 Gender Score: 0         STOP-Bang Score:  5       Physical Exam:   VS:  BP 111/74   Pulse 73   Ht 5\' 10"  (1.778 m)   Wt 247 lb (112 kg)   SpO2 98%   BMI 35.44 kg/m    Wt Readings from Last 3 Encounters:  12/18/23 247 lb (112 kg)  11/12/23 244 lb 3.2 oz (110.8 kg)  10/29/23 239 lb 2 oz (108.5 kg)     GEN: Well nourished, well developed in no acute distress NECK: No JVD CARDIAC: Normal rate, irregular rhythm. RESPIRATORY:  Clear to auscultation without rales, wheezing or rhonchi  ABDOMEN: Soft, non-tender, non-distended EXTREMITIES:  No edema; No deformity   ASSESSMENT AND PLAN:   Shane Gonzalez is a 68 y.o. male with h/o persistent atrial fibrillation, hyperlipidemia, and GERD who is being seen today for evaluation of atrial fibrillation at the request of Nicolasa Ducking, NP.  Patient diagnosed with paroxysmal atrial fibrillation related to COVID infection in 2021.  It appears that now he has progressed to persistent atrial fibrillation.  #Symptomatic persistent atrial fibrillation: -Start dronedarone 400mg  BID as a bridge to ablation. Continue low dose metoprolol. -He has been on Xarelto for >1 month with no interruption. Cardioversion this week.  -Discussed treatment options today for AF including antiarrhythmic drug therapy and ablation.  Discussed risks, recovery and likelihood of success with each treatment strategy. Risk, benefits, and alternatives to EP study and ablation for afib were discussed. These risks include but are not limited to stroke, bleeding, vascular damage, tamponade, perforation, damage to the esophagus, lungs, phrenic nerve and other structures, pulmonary vein stenosis, worsening renal function, coronary vasospasm and death.  Discussed potential need for repeat ablation procedures and antiarrhythmic drugs after an initial ablation. The patient  understands these risk and wishes to proceed.  We will therefore proceed with catheter ablation at the next available time.  Carto, ICE, anesthesia are requested for the procedure.  Will also obtain CT PV protocol prior to the procedure to exclude LAA thrombus and further evaluate atrial anatomy.   #Secondary hypercoagulable state due to atrial fibrillation:  -CHA2DS2-VASc score of 1. -Continue Xarelto in preparation for cardioversion.  He will need to remain on this for 1 month post cardioversion.  #Sleep disordered breathing: Patient endorses loud snoring and has a STOP-BANG score of 5. -Sleep study has been ordered.   Signed, Nobie Putnam, MD

## 2023-12-18 ENCOUNTER — Ambulatory Visit: Payer: Medicare Other | Attending: Cardiology | Admitting: Cardiology

## 2023-12-18 ENCOUNTER — Other Ambulatory Visit: Payer: Self-pay

## 2023-12-18 ENCOUNTER — Telehealth: Payer: Self-pay | Admitting: Cardiology

## 2023-12-18 ENCOUNTER — Encounter: Payer: Self-pay | Admitting: Cardiology

## 2023-12-18 VITALS — BP 111/74 | HR 73 | Ht 70.0 in | Wt 247.0 lb

## 2023-12-18 DIAGNOSIS — I4819 Other persistent atrial fibrillation: Secondary | ICD-10-CM | POA: Diagnosis not present

## 2023-12-18 DIAGNOSIS — G4730 Sleep apnea, unspecified: Secondary | ICD-10-CM

## 2023-12-18 DIAGNOSIS — D6869 Other thrombophilia: Secondary | ICD-10-CM | POA: Diagnosis not present

## 2023-12-18 MED ORDER — DRONEDARONE HCL 400 MG PO TABS: 400.0000 mg | ORAL_TABLET | Freq: Two times a day (BID) | ORAL | 3 refills | Status: DC

## 2023-12-18 NOTE — Telephone Encounter (Signed)
 Patient reports that he is not able to afford Multaq . He is not eligible to use a co-pay card as he has Medicare. He will most likely need to meet a deductible of $250-350 prior to having a copay for his medications. Since he is not going to be on this medication long term he wanted to know if there was something else that he could take instead that would be more affordable. Will check with Dr. Kennyth for recommendations.

## 2023-12-18 NOTE — Telephone Encounter (Signed)
 Pt c/o medication issue:  1. Name of Medication:  dronedarone  (MULTAQ ) 400 MG tablet  2. How are you currently taking this medication (dosage and times per day)?   3. Are you having a reaction (difficulty breathing--STAT)?   4. What is your medication issue?   Patient states Multaq  will cost him $300 for a 90 day supply and he would like to discuss other options. He also assumes a 90 day supply is too much.

## 2023-12-18 NOTE — Patient Instructions (Signed)
 Medication Instructions:  Your physician has recommended you make the following change in your medication:  1) START taking Multaq  400 mg twice daily   *If you need a refill on your cardiac medications before your next appointment, please call your pharmacy*   Lab Work: TODAY: BMET and CBC   If you have labs (blood work) drawn today and your tests are completely normal, you will receive your results only by: MyChart Message (if you have MyChart) OR A paper copy in the mail If you have any lab test that is abnormal or we need to change your treatment, we will call you to review the results.   Testing/Procedures: Cardioversion Your physician has recommended that you have a Cardioversion (DCCV). Electrical Cardioversion uses a jolt of electricity to your heart either through paddles or wired patches attached to your chest. This is a controlled, usually prescheduled, procedure. Defibrillation is done under light anesthesia in the hospital, and you usually go home the day of the procedure. This is done to get your heart back into a normal rhythm. You are not awake for the procedure. Please see the instruction sheet given to you today.  Cardiac CT Your physician has requested that you have cardiac CT. Cardiac computed tomography (CT) is a painless test that uses an x-ray machine to take clear, detailed pictures of your heart. For further information please visit [email or web address] Please follow instruction sheet as given. We will call you to schedule your CT scan. It will be done about three weeks prior to your ablation.   Ablation Your physician has recommended that you have an ablation. Catheter ablation is a medical procedure used to treat some cardiac arrhythmias (irregular heartbeats). During catheter ablation, a long, thin, flexible tube is put into a blood vessel in your groin (upper thigh), or neck. This tube is called an ablation catheter. It is then guided to your heart through the  blood vessel. Radio frequency waves destroy small areas of heart tissue where abnormal heartbeats may cause an arrhythmia to start. Please see the instruction sheet given to you today.   Follow-Up: At [facility], you and your health needs are our priority.  As part of our continuing mission to provide you with exceptional heart care, we have created designated Provider Care Teams.  These Care Teams include your primary Cardiologist (physician) and Advanced Practice Providers (APPs -  Physician Assistants and Nurse Practitioners) who all work together to provide you with the care you need, when you need it.  Your next appointment:   See instruction letter

## 2023-12-19 ENCOUNTER — Ambulatory Visit: Payer: Medicare Other | Admitting: Nurse Practitioner

## 2023-12-19 LAB — CBC
Hematocrit: 45.2 % (ref 37.5–51.0)
Hemoglobin: 15.1 g/dL (ref 13.0–17.7)
MCH: 31.8 pg (ref 26.6–33.0)
MCHC: 33.4 g/dL (ref 31.5–35.7)
MCV: 95 fL (ref 79–97)
Platelets: 214 10*3/uL (ref 150–450)
RBC: 4.75 x10E6/uL (ref 4.14–5.80)
RDW: 11.9 % (ref 11.6–15.4)
WBC: 4.6 10*3/uL (ref 3.4–10.8)

## 2023-12-19 LAB — BASIC METABOLIC PANEL
BUN/Creatinine Ratio: 19 (ref 10–24)
BUN: 17 mg/dL (ref 8–27)
CO2: 22 mmol/L (ref 20–29)
Calcium: 10 mg/dL (ref 8.6–10.2)
Chloride: 105 mmol/L (ref 96–106)
Creatinine, Ser: 0.89 mg/dL (ref 0.76–1.27)
Glucose: 92 mg/dL (ref 70–99)
Potassium: 5.5 mmol/L — ABNORMAL HIGH (ref 3.5–5.2)
Sodium: 143 mmol/L (ref 134–144)
eGFR: 95 mL/min/{1.73_m2} (ref >59)

## 2023-12-19 MED ORDER — AMIODARONE HCL 200 MG PO TABS: ORAL_TABLET | ORAL | 3 refills | Status: DC

## 2023-12-19 NOTE — Progress Notes (Signed)
Name: Shane Gonzalez MRN: [id number] DOB: 10-Jan-1956    CHIEF COMPLAINT:  EXCESSIVE DAYTIME SLEEPINESS ASSESSMENT FOR SLEEP APNEA   HISTORY OF PRESENT ILLNESS: Patient is seen today for problems and issues with sleep related to excessive daytime sleepiness Patient  has been having sleep problems for many years Patient has been having excessive daytime sleepiness for a long time Patient has been having extreme fatigue and tiredness, lack of energy +  very Loud snoring every night + struggling breathe at night and gasps for air + morning headaches + Nonrefreshing sleep  Discussed sleep data and reviewed with patient.  Encouraged proper weight management.  Discussed driving precautions and its relationship with hypersomnolence.  Discussed operating dangerous equipment and its relationship with hypersomnolence.  Discussed sleep hygiene, and benefits of a fixed sleep waked time.  The importance of getting eight or more hours of sleep discussed with patient.  Discussed limiting the use of the computer and television before bedtime.  Decrease naps during the day, so night time sleep will become enhanced.  Limit caffeine, and sleep deprivation.  HTN, stroke, and heart failure are potential risk factors.    EPWORTH SLEEP SCORE 2  Cardiac history Patient was first diagnosed with atrial fibrillation related to a COVID infection in 2021.  He did well for many years and then was found to have an irregular heart rate by home health nurse in late 2024. Patient present with  persistent atrial fibrillation was referred to cardiology Cardiology placed patient on anticoagulation and plan for electrical cardioversion in the next several days Patient is plan to have ablation therapy in February Symptomatic persistent atrial fibrillation: -Start dronedarone 400mg  BID as a bridge to ablation. Continue low dose metoprolol.   Patient was referred to [place] to assess for underlying sleep apnea Patient  currently weighs 240 pounds 5 foot 10 inches  Works as a Marketing executive cigars intermittently Occasional alcohol use    PAST MEDICAL HISTORY :   has a past medical history of Aortic valve sclerosis, Bilateral bunions, Blood in stool, Diverticulosis, Erectile dysfunction, GERD (gastroesophageal reflux disease), Hyperlipidemia, PAF (paroxysmal atrial fibrillation) (HCC), Toe deformity, and Toenail fungus.  has a past surgical history that includes Anterior cruciate ligament repair; Shoulder surgery; Closed reduction shoulder dislocation; Wisdom tooth extraction (2011); Bunionectomy (2011); hammer toe repair (2011); Tendon repair (2011); Refractive surgery (2009); Vasectomy; Arthroscopic repair ACL; Arthroscopic repair PCL; right shoulder ; Bunionectomy (2011); and Vasectomy. Prior to Admission medications   Medication Sig Start Date End Date Taking? Authorizing Provider  amiodarone (PACERONE) 200 MG tablet Take 2 tablets (400 mg total) by mouth 2 (two) times daily for 7 days, THEN 2 tablets (400 mg total) daily for 7 days, THEN 1 tablet (200 mg total) daily. 12/19/23 02/01/24  Nobie Putnam, MD  ibuprofen (ADVIL) 200 MG tablet Take 400 mg by mouth every 6 (six) hours as needed for moderate pain (pain score 4-6).    [provider]  metoprolol succinate (TOPROL XL) 25 MG 24 hr tablet Take 0.5 tablets (12.5 mg total) by mouth daily. 11/12/23   Creig Hines, NP  rivaroxaban (XARELTO) 20 MG TABS tablet Take 1 tablet (20 mg total) by mouth daily with supper. 10/29/23   Tower, Audrie Gallus, MD  sildenafil (VIAGRA) 50 MG tablet Take 1 tablet (50 mg total) by mouth daily as needed for erectile dysfunction. 06/28/23   Tower, Audrie Gallus, MD   No Known Allergies  FAMILY HISTORY:  family history includes Alcohol abuse in his  brother and brother; Cancer in his brother, father, mother, paternal aunt, and paternal uncle; Diverticulitis in his brother, brother, and sister; Heart disease in his  maternal grandfather; Hypertension in his father; Prostate cancer in his brother; Stroke in his mother; Vision loss in his father and mother. SOCIAL HISTORY:  reports that he has quit smoking. His smoking use included cigars. He has never used smokeless tobacco. He reports current alcohol use of about 8.0 standard drinks of alcohol per week. He reports that he does not use drugs.   Review of Systems:  Gen:  Denies  fever, sweats, chills weight loss  HEENT: Denies blurred vision, double vision, ear pain, eye pain, hearing loss, nose bleeds, sore throat Cardiac:  No dizziness, chest pain or heaviness, chest tightness,edema, No JVD Resp:   No cough, -sputum production, -shortness of breath,-wheezing, -hemoptysis,  Gi: Denies swallowing difficulty, stomach pain, nausea or vomiting, diarrhea, constipation, bowel incontinence Gu:  Denies bladder incontinence, burning urine Ext:   Denies Joint pain, stiffness or swelling Skin: Denies  skin rash, easy bruising or bleeding or hives Endoc:  Denies polyuria, polydipsia , polyphagia or weight change Psych:   Denies depression, insomnia or hallucinations  Other:  All other systems negative   ALL OTHER ROS ARE NEGATIVE  BP 126/76 (BP Location: Left Arm, Patient Position: Sitting, Cuff Size: Normal)   Pulse 68   Temp 97.8 F (36.6 C) (Temporal)   Ht 5\' 10"  (1.778 m)   Wt 246 lb (111.6 kg)   SpO2 97%   BMI 35.30 kg/m     Physical Examination:   General Appearance: No distress  EYES PERRLA, EOM intact.   NECK Supple, No JVD Pulmonary: normal breath sounds, No wheezing.  CardiovascularNormal S1,S2.  No m/r/g.   Abdomen: Benign, Soft, non-tender. Skin:   warm, no rashes, no ecchymosis  Extremities: normal, no cyanosis, clubbing. Neuro:without focal findings,  speech normal  PSYCHIATRIC: Mood, affect within normal limits.   ALL OTHER ROS ARE NEGATIVE  Echocardiogram December 2024 Ejection fraction 60%  CT chest January 2021 Reviewed  in detail independently today Bilateral interstitial opacities multifocal History of COVID-19 infection  CBC    Component Value Date/Time   WBC 4.6 12/18/2023 0843   WBC 4.7 10/29/2023 1555   RBC 4.75 12/18/2023 0843   RBC 4.52 10/29/2023 1555   HGB 15.1 12/18/2023 0843   HCT 45.2 12/18/2023 0843   PLT 214 12/18/2023 0843   MCV 95 12/18/2023 0843   MCH 31.8 12/18/2023 0843   MCH 32.9 11/02/2020 0918   MCHC 33.4 12/18/2023 0843   MCHC 34.1 10/29/2023 1555   RDW 11.9 12/18/2023 0843   LYMPHSABS 1.4 10/29/2023 1555   MONOABS 0.5 10/29/2023 1555   EOSABS 0.1 10/29/2023 1555   BASOSABS 0.0 10/29/2023 1555      Latest Ref Rng & Units 12/18/2023    8:43 AM 10/29/2023    3:55 PM 06/11/2023    8:05 AM  BMP  Glucose 70 - 99 mg/dL 92  75  98   BUN 8 - 27 mg/dL 17  21  18    Creatinine 0.76 - 1.27 mg/dL 1.30  8.65  7.84   BUN/Creat Ratio 10 - 24 19     Sodium 134 - 144 mmol/L 143  139  139   Potassium 3.5 - 5.2 mmol/L 5.5  4.9  4.6   Chloride 96 - 106 mmol/L 105  103  104   CO2 20 - 29 mmol/L 22  27  27  Calcium 8.6 - 10.2 mg/dL 78.2  9.8  9.3      ASSESSMENT AND PLAN SYNOPSIS 68 year old pleasant white male seen today for assessment of signs and symptoms of excessive daytime sleepiness with probable underlying diagnosis of obstructive sleep apnea in the setting of obesity and deconditioned state with underlying atrial fibrillation   Recommend Sleep Study for definitve diagnosis And home sleep study to assess for OSA  Obesity -recommend significant weight loss -recommend changing diet  Deconditioned state -Recommend increased daily activity and exercise  Persistent A-fib Plan for electrical cardioversion in the next several days Plan for ablation therapy in 1 month Continue anticoagulation as prescribed Follow-up with cardiology  Patient Instructions  Be aware of reduced alertness and do not drive or operate heavy machinery if experiencing this or drowsiness.   Exercise encouraged, as tolerated. Encouraged proper weight management.  Important to get eight or more hours of sleep  Limiting the use of the computer and television before bedtime.  Decrease naps during the day, so night time sleep will become enhanced.  Limit caffeine, and sleep deprivation.  HTN, stroke, uncontrolled diabetes and heart failure are potential risk factors.  Risk of untreated sleep apnea including cardiac arrhthymias, stroke, DM, pulm HTN.    MEDICATION ADJUSTMENTS/LABS AND TESTS ORDERED: Recommend Sleep Study Recommend weight loss Follow-up with cardiology   CURRENT MEDICATIONS REVIEWED AT LENGTH WITH PATIENT TODAY   Patient  satisfied with Plan of action and management. All questions answered  Follow up  3 months  Total Time Spent  65 mins   Lucie Leather, M.D.  [facility]  Medical Director [facility] Medical Director [facility] Cardio-Pulmonary Department

## 2023-12-19 NOTE — Telephone Encounter (Signed)
 Spoke with the patient and advised him on recommendations from Dr. Daneil Dunker. Patient is in agreement with plan. Prescription has been sent in.

## 2023-12-20 ENCOUNTER — Encounter: Payer: Self-pay | Admitting: Internal Medicine

## 2023-12-20 ENCOUNTER — Ambulatory Visit: Payer: Medicare Other | Admitting: Internal Medicine

## 2023-12-20 VITALS — BP 126/76 | HR 68 | Temp 97.8°F | Ht 70.0 in | Wt 246.0 lb

## 2023-12-20 DIAGNOSIS — R0683 Snoring: Secondary | ICD-10-CM

## 2023-12-20 DIAGNOSIS — I4891 Unspecified atrial fibrillation: Secondary | ICD-10-CM | POA: Diagnosis not present

## 2023-12-20 DIAGNOSIS — G4719 Other hypersomnia: Secondary | ICD-10-CM

## 2023-12-20 NOTE — Progress Notes (Signed)
 Spoke to pt and instructed them to come at 0745 and to be NPO after 0000.  Confirmed no missed doses of AC and instructed to take in AM with a small sip of water.  Confirmed that pt will have a ride home and someone to stay with them for 24 hours after the procedure. Instructed patient to not wear any jewelry or lotion.

## 2023-12-20 NOTE — Anesthesia Preprocedure Evaluation (Addendum)
Anesthesia Evaluation  Patient identified by MRN, date of birth, ID band Patient awake    Reviewed: Allergy & Precautions, NPO status , Patient's Chart, lab work & pertinent test results  Airway Mallampati: III  TM Distance: >3 FB Neck ROM: Full    Dental no notable dental hx. (+) Dental Advisory Given, Teeth Intact   Pulmonary former smoker   Pulmonary exam normal breath sounds clear to auscultation       Cardiovascular hypertension, Pt. on home beta blockers + dysrhythmias Atrial Fibrillation  Rhythm:Irregular Rate:Normal  Echo   1. Left ventricular ejection fraction, by estimation, is 60 to 65%. The left ventricle has normal function. The left ventricle has no regional wall motion abnormalities. Left ventricular diastolic parameters are indeterminate.   2. Right ventricular systolic function is normal. The right ventricular size is normal.   3. The mitral valve is normal in structure. Mild mitral valve regurgitation.   4. The aortic valve was not well visualized. Aortic valve regurgitation is not visualized. Aortic valve sclerosis is present, with no evidence of aortic valve stenosis.   5. The inferior vena cava is normal in size with greater than 50% respiratory variability, suggesting right atrial pressure of 3 mmHg.      Neuro/Psych negative neurological ROS     GI/Hepatic Neg liver ROS,GERD  ,,  Endo/Other  negative endocrine ROS    Renal/GU negative Renal ROS     Musculoskeletal negative musculoskeletal ROS (+)    Abdominal  (+) + obese  Peds  Hematology negative hematology ROS (+)   Anesthesia Other Findings   Reproductive/Obstetrics                             Anesthesia Physical Anesthesia Plan  ASA: 3  Anesthesia Plan: General   Post-op Pain Management: Minimal or no pain anticipated   Induction: Intravenous  PONV Risk Score and Plan: 2 and Treatment may vary due to age  or medical condition, Propofol infusion and TIVA  Airway Management Planned: Natural Airway and Mask  Additional Equipment:   Intra-op Plan:   Post-operative Plan:   Informed Consent: I have reviewed the patients History and Physical, chart, labs and discussed the procedure including the risks, benefits and alternatives for the proposed anesthesia with the patient or authorized representative who has indicated his/her understanding and acceptance.     Dental advisory given  Plan Discussed with: CRNA  Anesthesia Plan Comments:        Anesthesia Quick Evaluation

## 2023-12-20 NOTE — Patient Instructions (Signed)
Recommend obtaining home sleep study to assess for sleep apnea  Follow-up with cardiology for cardioversion  Recommend weight loss  Avoid Allergens and Irritants Avoid secondhand smoke Avoid SICK contacts Recommend  Masking  when appropriate Recommend Keep up-to-date with vaccinations

## 2023-12-21 ENCOUNTER — Other Ambulatory Visit: Payer: Self-pay

## 2023-12-21 ENCOUNTER — Ambulatory Visit (HOSPITAL_COMMUNITY): Payer: Medicare Other | Admitting: Certified Registered"

## 2023-12-21 ENCOUNTER — Encounter (HOSPITAL_COMMUNITY)
Admission: RE | Disposition: A | Discharge status: Home / Self Care | Payer: Self-pay | Source: Home / Self Care | Attending: Internal Medicine

## 2023-12-21 ENCOUNTER — Ambulatory Visit (HOSPITAL_COMMUNITY)
Admission: RE | Admit: 2023-12-21 | Discharge: 2023-12-21 | Disposition: A | Discharge status: Home / Self Care | Payer: Medicare Other | Attending: Internal Medicine | Admitting: Internal Medicine

## 2023-12-21 DIAGNOSIS — I4891 Unspecified atrial fibrillation: Secondary | ICD-10-CM | POA: Diagnosis not present

## 2023-12-21 DIAGNOSIS — E785 Hyperlipidemia, unspecified: Secondary | ICD-10-CM

## 2023-12-21 DIAGNOSIS — Z006 Encounter for examination for normal comparison and control in clinical research program: Secondary | ICD-10-CM

## 2023-12-21 DIAGNOSIS — R0683 Snoring: Secondary | ICD-10-CM | POA: Diagnosis not present

## 2023-12-21 DIAGNOSIS — Z79899 Other long term (current) drug therapy: Secondary | ICD-10-CM | POA: Insufficient documentation

## 2023-12-21 DIAGNOSIS — Z7901 Long term (current) use of anticoagulants: Secondary | ICD-10-CM | POA: Diagnosis not present

## 2023-12-21 DIAGNOSIS — D6869 Other thrombophilia: Secondary | ICD-10-CM | POA: Insufficient documentation

## 2023-12-21 DIAGNOSIS — I4819 Other persistent atrial fibrillation: Secondary | ICD-10-CM | POA: Diagnosis not present

## 2023-12-21 DIAGNOSIS — Z87891 Personal history of nicotine dependence: Secondary | ICD-10-CM | POA: Diagnosis not present

## 2023-12-21 DIAGNOSIS — K219 Gastro-esophageal reflux disease without esophagitis: Secondary | ICD-10-CM | POA: Diagnosis not present

## 2023-12-21 DIAGNOSIS — Z01818 Encounter for other preprocedural examination: Secondary | ICD-10-CM

## 2023-12-21 DIAGNOSIS — I10 Essential (primary) hypertension: Secondary | ICD-10-CM

## 2023-12-21 HISTORY — PX: CARDIOVERSION: EP1203

## 2023-12-21 SURGERY — CARDIOVERSION (CATH LAB)
Anesthesia: General

## 2023-12-21 MED ORDER — LIDOCAINE 2% (20 MG/ML) 5 ML SYRINGE
INTRAMUSCULAR | Status: DC | PRN
  Administered 2023-12-21: 100 mg via INTRAVENOUS

## 2023-12-21 MED ORDER — PROPOFOL 10 MG/ML IV BOLUS
INTRAVENOUS | Status: DC | PRN
  Administered 2023-12-21: 70 mg via INTRAVENOUS
  Administered 2023-12-21: 20 mg via INTRAVENOUS

## 2023-12-21 MED ORDER — SODIUM CHLORIDE 0.9 % IV SOLN: INTRAVENOUS | Status: DC

## 2023-12-21 SURGICAL SUPPLY — 1 items: PAD DEFIB RADIO PHYSIO CONN (PAD) ×1 IMPLANT

## 2023-12-21 NOTE — Research (Unsigned)
[facility] Cardioversion Informed Consent   Subject Name: Shane Gonzalez  Subject met inclusion and exclusion criteria.  The informed consent form, study requirements and expectations were reviewed with the subject and questions and concerns were addressed prior to the signing of the consent form.  The subject verbalized understanding of the trial requirements.  The subject agreed to participate in the [employer] Cardioversion trial and signed the informed consent at 0920 on 17/JAN/2025.  The informed consent was obtained prior to performance of any protocol-specific procedures for the subject.  A copy of the signed informed consent was given to the subject and a copy was placed in the subject's medical record.   Vincient Vanaman A Roxan Hockey

## 2023-12-21 NOTE — CV Procedure (Signed)
Electrical Cardioversion Procedure Note Shane Gonzalez [id number] 04/07/1956  Procedure: Electrical Cardioversion Indications:  Atrial Fibrillation  Procedure Details Consent: Risks of procedure as well as the alternatives and risks of each were explained to the (patient/caregiver).  Consent for procedure obtained. Time Out: Verified patient identification, verified procedure, site/side was marked, verified correct patient position, special equipment/implants available, medications/allergies/relevent history reviewed, required imaging and test results available.  Performed  Patient placed on cardiac monitor, pulse oximetry, supplemental oxygen as necessary.  Sedation given:  propofol and lidocaine Pacer pads placed anterior and posterior chest.  Cardioverted 2 time(s).  Cardioverted at 250 J biphasic then 360 J biphasic.  Evaluation Findings: Post procedure EKG shows: NSR Complications: None Patient did tolerate procedure well.   Dietrich Pates 12/21/2023, 10:29 AM

## 2023-12-21 NOTE — Anesthesia Postprocedure Evaluation (Signed)
Anesthesia Post Note  Patient: Shane Gonzalez  Procedure(s) Performed: CARDIOVERSION     Patient location during evaluation: Cath Lab Anesthesia Type: General Level of consciousness: sedated and patient cooperative Pain management: pain level controlled Vital Signs Assessment: post-procedure vital signs reviewed and stable Respiratory status: spontaneous breathing Cardiovascular status: stable Anesthetic complications: no   No notable events documented.  Last Vitals:  Vitals:   12/21/23 1050 12/21/23 1101  BP: 114/80 110/80  Pulse: 62 63  Resp: 16 19  Temp:    SpO2: 98% 99%    Last Pain:  Vitals:   12/21/23 1039  TempSrc: Temporal  PainSc: 0-No pain                 [name]

## 2023-12-21 NOTE — Interval H&P Note (Signed)
History and Physical Interval Note:  12/21/2023 8:57 AM  Shane Gonzalez  has presented today for surgery, with the diagnosis of afib.  The various methods of treatment have been discussed with the patient and family. After consideration of risks, benefits and other options for treatment, the patient has consented to  Procedure(s): CARDIOVERSION (N/A) as a surgical intervention.  The patient's history has been reviewed, patient examined, no change in status, stable for surgery.  I have reviewed the patient's chart and labs.  Questions were answered to the patient's satisfaction.     Dietrich Pates

## 2023-12-21 NOTE — Transfer of Care (Signed)
Immediate Anesthesia Transfer of Care Note  Patient: Shane Gonzalez  Procedure(s) Performed: CARDIOVERSION  Patient Location: PACU and Cath Lab  Anesthesia Type:General  Level of Consciousness: awake, alert , and oriented  Airway & Oxygen Therapy: Patient Spontanous Breathing and Patient connected to nasal cannula oxygen  Post-op Assessment: Report given to RN and Post -op Vital signs reviewed and stable  Post vital signs: Reviewed and stable  Last Vitals:  Vitals Value Taken Time  BP    Temp    Pulse    Resp    SpO2      Last Pain:  Vitals:   12/21/23 0752  TempSrc: Temporal  PainSc: 0-No pain         Complications: No notable events documented.

## 2023-12-25 ENCOUNTER — Encounter (HOSPITAL_COMMUNITY): Payer: Self-pay | Admitting: Internal Medicine

## 2023-12-25 ENCOUNTER — Ambulatory Visit
Admission: RE | Admit: 2023-12-25 | Discharge: 2023-12-25 | Disposition: A | Discharge status: Home / Self Care | Payer: Medicare Other | Source: Ambulatory Visit | Attending: Cardiology | Admitting: Cardiology

## 2023-12-25 DIAGNOSIS — I4819 Other persistent atrial fibrillation: Secondary | ICD-10-CM | POA: Diagnosis not present

## 2023-12-25 MED ORDER — IOHEXOL 350 MG/ML SOLN
100.0000 mL | Freq: Once | INTRAVENOUS | Status: AC | PRN
  Administered 2023-12-25: 100 mL via INTRAVENOUS

## 2023-12-25 MED ORDER — SODIUM CHLORIDE 0.9 % IV SOLN: INTRAVENOUS | Status: DC

## 2024-01-03 ENCOUNTER — Ambulatory Visit: Payer: Medicare Other

## 2024-01-03 DIAGNOSIS — R0683 Snoring: Secondary | ICD-10-CM

## 2024-01-03 DIAGNOSIS — G4719 Other hypersomnia: Secondary | ICD-10-CM

## 2024-01-03 DIAGNOSIS — G4730 Sleep apnea, unspecified: Secondary | ICD-10-CM | POA: Diagnosis not present

## 2024-01-10 ENCOUNTER — Telehealth: Payer: Self-pay | Admitting: Cardiology

## 2024-01-10 ENCOUNTER — Encounter: Payer: Self-pay | Admitting: Cardiology

## 2024-01-10 DIAGNOSIS — Z79899 Other long term (current) drug therapy: Secondary | ICD-10-CM

## 2024-01-10 NOTE — Telephone Encounter (Signed)
 Pt called in concerned that his CT has not been reviewed yet and his ablation is scheduled for next week. Please advise.

## 2024-01-10 NOTE — Telephone Encounter (Signed)
 The patient was calling for his results of the CT. He would also like to ask Dr. Orinda Birkenhead nurse a few questions which he did not want to disclose.

## 2024-01-10 NOTE — Telephone Encounter (Signed)
 Fonda Kitty, MD 01/10/2024 10:08 AM EST     CT scan shows normal anatomy. No clot in the left atrial appendage. Okay to proceed with ablation.   The patient has been notified of the result and verbalized understanding.  All questions (if any) were answered. Nili Honda Chauvigne, RN 01/10/2024 2:25 PM    Patient would like to know if he needs to follow up with gen cards in regards to calcium  score on CT scan.

## 2024-01-10 NOTE — Telephone Encounter (Signed)
 With elevated coronary calcium  score and LDL of 117 in 06/2023, I do recommend initiation of atorvastatin  40 mg daily.  B/c he is on a blood thinner, we will forgo adding aspirin .  We previously discussed that if his symptoms didn't improve in sinus rhythm that coronary CTA or alternate ischemic/stress testing would be warranted.  If he continues to note intermittent chest pressure, pls arrange gen cards f/u w/ me. If he's willing to start atorvastatin , he should have f/u lipids/lfts 6 wks later.

## 2024-01-11 MED ORDER — ATORVASTATIN CALCIUM 40 MG PO TABS: 40.0000 mg | ORAL_TABLET | Freq: Every day | ORAL | 3 refills | Status: AC

## 2024-01-11 NOTE — Telephone Encounter (Signed)
 Called and spoke with patient. Informed him of the following from Lonni Meager, NP.      With elevated coronary calcium  score and LDL of 117 in 06/2023, I do recommend initiation of atorvastatin  40 mg daily.  B/c he is on a blood thinner, we will forgo adding aspirin .  We previously discussed that if his symptoms didn't improve in sinus rhythm that coronary CTA or alternate ischemic/stress testing would be warranted.  If he continues to note intermittent chest pressure, pls arrange gen cards f/u w/ me. If he's willing to start atorvastatin , he should have f/u lipids/lfts 6 wks later.     Patient verbalizes understanding. Prescription sent to preferred pharmacy. Labs orders placed. Will also send MyChart message per patient request.

## 2024-01-11 NOTE — Telephone Encounter (Signed)
 Pt returning call/requesting c/b

## 2024-01-11 NOTE — Telephone Encounter (Signed)
 Left a message for the patient to call back.

## 2024-01-15 ENCOUNTER — Telehealth: Payer: Self-pay | Admitting: Pulmonary Disease

## 2024-01-15 DIAGNOSIS — G4733 Obstructive sleep apnea (adult) (pediatric): Secondary | ICD-10-CM

## 2024-01-15 NOTE — Addendum Note (Signed)
Addended by: Bonney Leitz on: 01/15/2024 04:24 PM   Modules accepted: Orders

## 2024-01-15 NOTE — Telephone Encounter (Signed)
I have notified the patient. He would like to use his wife's CPAP machine that she no longer uses. I have placed the order to [facility] to have them change the settings and supply him with a new mask. Nothing further needed.

## 2024-01-15 NOTE — Telephone Encounter (Signed)
Call patient  Sleep study result  Date of study: 01/03/2024  Impression: Moderate obstructive sleep apnea with moderately severe oxygen desaturations  Recommendation: DME referral  Recommend CPAP therapy for moderate obstructive sleep apnea  Auto titrating CPAP with pressure settings of 5-20 will be appropriate  Encourage weight loss measures  Follow-up in the office 4 to 6 weeks following initiation of treatment

## 2024-01-16 NOTE — Anesthesia Preprocedure Evaluation (Signed)
Anesthesia Evaluation  Patient identified by MRN, date of birth, ID band Patient awake    Reviewed: Allergy & Precautions, NPO status , Patient's Chart, lab work & pertinent test results  Airway Mallampati: III  TM Distance: >3 FB Neck ROM: Limited    Dental no notable dental hx. (+) Dental Advisory Given, Teeth Intact   Pulmonary former smoker   Pulmonary exam normal breath sounds clear to auscultation       Cardiovascular hypertension, Pt. on home beta blockers Normal cardiovascular exam+ dysrhythmias Atrial Fibrillation + Valvular Problems/Murmurs MR  Rhythm:Regular Rate:Bradycardia  Echo 11/2023  1. Left ventricular ejection fraction, by estimation, is 60 to 65%. The left ventricle has normal function. The left ventricle has no regional wall motion abnormalities. Left ventricular diastolic parameters are indeterminate.   2. Right ventricular systolic function is normal. The right ventricular size is normal.   3. The mitral valve is normal in structure. Mild mitral valve regurgitation.   4. The aortic valve was not well visualized. Aortic valve regurgitation is not visualized. Aortic valve sclerosis is present, with no evidence of aortic valve stenosis.   5. The inferior vena cava is normal in size with greater than 50% respiratory variability, suggesting right atrial pressure of 3 mmHg.      Neuro/Psych negative neurological ROS     GI/Hepatic Neg liver ROS,GERD  ,,  Endo/Other  negative endocrine ROS    Renal/GU negative Renal ROS     Musculoskeletal negative musculoskeletal ROS (+)    Abdominal  (+) + obese  Peds  Hematology negative hematology ROS (+)   Anesthesia Other Findings   Reproductive/Obstetrics                             Anesthesia Physical Anesthesia Plan  ASA: 3  Anesthesia Plan: General   Post-op Pain Management: Tylenol PO (pre-op)*   Induction:  Intravenous  PONV Risk Score and Plan: 2 and Treatment may vary due to age or medical condition, Dexamethasone and Ondansetron  Airway Management Planned: Oral ETT  Additional Equipment:   Intra-op Plan:   Post-operative Plan: Extubation in OR  Informed Consent: I have reviewed the patients History and Physical, chart, labs and discussed the procedure including the risks, benefits and alternatives for the proposed anesthesia with the patient or authorized representative who has indicated his/her understanding and acceptance.     Dental advisory given  Plan Discussed with: CRNA  Anesthesia Plan Comments: (Risks of anesthesia explained at length. This includes, but is not limited to, sore throat, damage to teeth, lips gums, tongue and vocal cords, nausea and vomiting, reactions to medications, stroke, heart attack, and death. All patient questions were answered and the patient wishes to proceed. )       Anesthesia Quick Evaluation

## 2024-01-16 NOTE — Pre-Procedure Instructions (Signed)
Instructed patient on the following items: Arrival time 0800 Nothing to eat or drink after midnight No meds AM of procedure Responsible person to drive you home and stay with you for 24 hrs  Have you missed any doses of anti-coagulant Xarelto- takes once a day, hasn't missed any doses.  Don't take dose in the morning.

## 2024-01-17 ENCOUNTER — Ambulatory Visit (HOSPITAL_COMMUNITY)
Admission: RE | Admit: 2024-01-17 | Discharge: 2024-01-17 | Disposition: A | Discharge status: Home / Self Care | Payer: Medicare Other | Attending: Cardiology | Admitting: Cardiology

## 2024-01-17 ENCOUNTER — Encounter (HOSPITAL_COMMUNITY)
Admission: RE | Disposition: A | Discharge status: Home / Self Care | Payer: Medicare Other | Source: Home / Self Care | Attending: Cardiology

## 2024-01-17 ENCOUNTER — Ambulatory Visit (HOSPITAL_COMMUNITY): Payer: Medicare Other | Admitting: Anesthesiology

## 2024-01-17 ENCOUNTER — Other Ambulatory Visit: Payer: Self-pay

## 2024-01-17 ENCOUNTER — Ambulatory Visit (HOSPITAL_BASED_OUTPATIENT_CLINIC_OR_DEPARTMENT_OTHER): Payer: Medicare Other | Admitting: Anesthesiology

## 2024-01-17 DIAGNOSIS — I10 Essential (primary) hypertension: Secondary | ICD-10-CM

## 2024-01-17 DIAGNOSIS — E785 Hyperlipidemia, unspecified: Secondary | ICD-10-CM | POA: Insufficient documentation

## 2024-01-17 DIAGNOSIS — Z79899 Other long term (current) drug therapy: Secondary | ICD-10-CM | POA: Diagnosis not present

## 2024-01-17 DIAGNOSIS — E119 Type 2 diabetes mellitus without complications: Secondary | ICD-10-CM | POA: Diagnosis not present

## 2024-01-17 DIAGNOSIS — D6869 Other thrombophilia: Secondary | ICD-10-CM | POA: Insufficient documentation

## 2024-01-17 DIAGNOSIS — Z7901 Long term (current) use of anticoagulants: Secondary | ICD-10-CM | POA: Diagnosis not present

## 2024-01-17 DIAGNOSIS — Z87891 Personal history of nicotine dependence: Secondary | ICD-10-CM | POA: Diagnosis not present

## 2024-01-17 DIAGNOSIS — K219 Gastro-esophageal reflux disease without esophagitis: Secondary | ICD-10-CM | POA: Insufficient documentation

## 2024-01-17 DIAGNOSIS — I4819 Other persistent atrial fibrillation: Secondary | ICD-10-CM

## 2024-01-17 DIAGNOSIS — R0683 Snoring: Secondary | ICD-10-CM | POA: Diagnosis not present

## 2024-01-17 DIAGNOSIS — I4811 Longstanding persistent atrial fibrillation: Secondary | ICD-10-CM | POA: Diagnosis not present

## 2024-01-17 DIAGNOSIS — Z01818 Encounter for other preprocedural examination: Secondary | ICD-10-CM

## 2024-01-17 HISTORY — PX: ATRIAL FIBRILLATION ABLATION: EP1191

## 2024-01-17 LAB — POCT ACTIVATED CLOTTING TIME
Activated Clotting Time: 302 s
Activated Clotting Time: 325 s

## 2024-01-17 LAB — BASIC METABOLIC PANEL
Anion gap: 12 (ref 5–15)
BUN: 13 mg/dL (ref 8–23)
CO2: 23 mmol/L (ref 22–32)
Calcium: 9.3 mg/dL (ref 8.9–10.3)
Chloride: 104 mmol/L (ref 98–111)
Creatinine, Ser: 0.91 mg/dL (ref 0.61–1.24)
GFR, Estimated: >60 mL/min (ref >60)
Glucose, Bld: 98 mg/dL (ref 70–99)
Potassium: 4.4 mmol/L (ref 3.5–5.1)
Sodium: 139 mmol/L (ref 135–145)

## 2024-01-17 SURGERY — ATRIAL FIBRILLATION ABLATION
Anesthesia: General

## 2024-01-17 MED ORDER — RIVAROXABAN 20 MG PO TABS
20.0000 mg | ORAL_TABLET | Freq: Once | ORAL | Status: AC
  Administered 2024-01-17: 20 mg via ORAL
  Filled 2024-01-17: qty 1

## 2024-01-17 MED ORDER — SODIUM CHLORIDE 0.9 % IV SOLN: INTRAVENOUS | Status: DC

## 2024-01-17 MED ORDER — FENTANYL CITRATE (PF) 100 MCG/2ML IJ SOLN
INTRAMUSCULAR | Status: AC
  Filled 2024-01-17: qty 2

## 2024-01-17 MED ORDER — HEPARIN SODIUM (PORCINE) 1000 UNIT/ML IJ SOLN
INTRAMUSCULAR | Status: AC
  Filled 2024-01-17: qty 1

## 2024-01-17 MED ORDER — EPHEDRINE SULFATE-NACL 50-0.9 MG/10ML-% IV SOSY
PREFILLED_SYRINGE | INTRAVENOUS | Status: DC | PRN
  Administered 2024-01-17 (×2): 5 mg via INTRAVENOUS

## 2024-01-17 MED ORDER — ONDANSETRON HCL 4 MG/2ML IJ SOLN: 4.0000 mg | Freq: Four times a day (QID) | INTRAMUSCULAR | Status: DC | PRN

## 2024-01-17 MED ORDER — PHENYLEPHRINE HCL-NACL 20-0.9 MG/250ML-% IV SOLN
INTRAVENOUS | Status: DC | PRN
  Administered 2024-01-17: 30 ug/min via INTRAVENOUS

## 2024-01-17 MED ORDER — ONDANSETRON HCL 4 MG/2ML IJ SOLN
INTRAMUSCULAR | Status: DC | PRN
  Administered 2024-01-17: 4 mg via INTRAVENOUS

## 2024-01-17 MED ORDER — ROCURONIUM BROMIDE 10 MG/ML (PF) SYRINGE
PREFILLED_SYRINGE | INTRAVENOUS | Status: DC | PRN
  Administered 2024-01-17: 20 mg via INTRAVENOUS
  Administered 2024-01-17: 100 mg via INTRAVENOUS

## 2024-01-17 MED ORDER — ACETAMINOPHEN 325 MG PO TABS: 650.0000 mg | ORAL_TABLET | ORAL | Status: DC | PRN

## 2024-01-17 MED ORDER — HEPARIN SODIUM (PORCINE) 1000 UNIT/ML IJ SOLN
INTRAMUSCULAR | Status: DC | PRN
Start: 2024-01-17 — End: 2024-01-17
  Administered 2024-01-17: 3000 [IU] via INTRAVENOUS
  Administered 2024-01-17: 16000 [IU] via INTRAVENOUS

## 2024-01-17 MED ORDER — FENTANYL CITRATE (PF) 250 MCG/5ML IJ SOLN
INTRAMUSCULAR | Status: DC | PRN
  Administered 2024-01-17: 100 ug via INTRAVENOUS

## 2024-01-17 MED ORDER — DEXAMETHASONE SODIUM PHOSPHATE 10 MG/ML IJ SOLN
INTRAMUSCULAR | Status: DC | PRN
  Administered 2024-01-17: 5 mg via INTRAVENOUS

## 2024-01-17 MED ORDER — LIDOCAINE 2% (20 MG/ML) 5 ML SYRINGE
INTRAMUSCULAR | Status: DC | PRN
  Administered 2024-01-17: 60 mg via INTRAVENOUS

## 2024-01-17 MED ORDER — SODIUM CHLORIDE 0.9% FLUSH: 3.0000 mL | INTRAVENOUS | Status: DC | PRN

## 2024-01-17 MED ORDER — ATROPINE SULFATE 1 MG/10ML IJ SOSY
PREFILLED_SYRINGE | INTRAMUSCULAR | Status: DC | PRN
  Administered 2024-01-17: 1 mg via INTRAVENOUS

## 2024-01-17 MED ORDER — AMIODARONE HCL 200 MG PO TABS: 200.0000 mg | ORAL_TABLET | Freq: Every day | ORAL | 0 refills | Status: DC

## 2024-01-17 MED ORDER — SUGAMMADEX SODIUM 200 MG/2ML IV SOLN
INTRAVENOUS | Status: DC | PRN
  Administered 2024-01-17: 400 mg via INTRAVENOUS

## 2024-01-17 MED ORDER — PROPOFOL 10 MG/ML IV BOLUS
INTRAVENOUS | Status: DC | PRN
  Administered 2024-01-17: 200 mg via INTRAVENOUS

## 2024-01-17 MED ORDER — SODIUM CHLORIDE 0.9% FLUSH: 3.0000 mL | Freq: Two times a day (BID) | INTRAVENOUS | Status: DC

## 2024-01-17 MED ORDER — PROTAMINE SULFATE 10 MG/ML IV SOLN
INTRAVENOUS | Status: DC | PRN
Start: 2024-01-17 — End: 2024-01-17
  Administered 2024-01-17: 35 mg via INTRAVENOUS

## 2024-01-17 MED ORDER — SODIUM CHLORIDE 0.9 % IV SOLN: 250.0000 mL | INTRAVENOUS | Status: DC | PRN

## 2024-01-17 MED ORDER — PHENYLEPHRINE 80 MCG/ML (10ML) SYRINGE FOR IV PUSH (FOR BLOOD PRESSURE SUPPORT)
PREFILLED_SYRINGE | INTRAVENOUS | Status: DC | PRN
  Administered 2024-01-17: 160 ug via INTRAVENOUS

## 2024-01-17 MED ORDER — HEPARIN (PORCINE) IN NACL 1000-0.9 UT/500ML-% IV SOLN
INTRAVENOUS | Status: DC | PRN
  Administered 2024-01-17 (×3): 500 mL

## 2024-01-17 SURGICAL SUPPLY — 23 items
BAG SNAP BAND KOVER 36X36 (MISCELLANEOUS) IMPLANT
BLANKET WARM UNDERBOD FULL ACC (MISCELLANEOUS) ×1 IMPLANT
CABLE PFA RX CATH CONN (CABLE) IMPLANT
CATH BI DIR 7FR CS F-J 12 PIN (CATHETERS) IMPLANT
CATH FARAWAVE ABLATION 31 (CATHETERS) IMPLANT
CATH OCTARAY 2.0 F 3-3-3-3-3 (CATHETERS) IMPLANT
CATH SOUNDSTAR ECO 8FR (CATHETERS) IMPLANT
CLOSURE PERCLOSE PROSTYLE (VASCULAR PRODUCTS) IMPLANT
COVER SWIFTLINK CONNECTOR (BAG) ×1 IMPLANT
DEVICE CLOSURE MYNXGRIP 6/7F (Vascular Products) IMPLANT
DILATOR VESSEL 38 20CM 16FR (INTRODUCER) IMPLANT
GUIDEWIRE INQWIRE 1.5J.035X260 (WIRE) IMPLANT
INQWIRE 1.5J .035X260CM (WIRE) ×1 IMPLANT
KIT VERSACROSS CNCT FARADRIVE (KITS) IMPLANT
MAT PREVALON FULL STRYKER (MISCELLANEOUS) IMPLANT
PACK EP LF (CUSTOM PROCEDURE TRAY) ×1 IMPLANT
PAD DEFIB RADIO PHYSIO CONN (PAD) ×1 IMPLANT
PATCH CARTO3 (PAD) IMPLANT
SHEATH FARADRIVE STEERABLE (SHEATH) IMPLANT
SHEATH PINNACLE 8F 10CM (SHEATH) IMPLANT
SHEATH PINNACLE 9F 10CM (SHEATH) IMPLANT
SHEATH PROBE COVER 6X72 (BAG) IMPLANT
WIRE HI TORQ VERSACORE-J 145CM (WIRE) IMPLANT

## 2024-01-17 NOTE — Interval H&P Note (Signed)
History and Physical Interval Note:  01/17/2024 10:15 AM  Shane Gonzalez  has presented today for surgery, with the diagnosis of symptomatic persistent atrial fibrillation.  The various methods of treatment have been discussed with the patient and family. After consideration of risks, benefits and other options for treatment, the patient has consented to  Procedure(s): ATRIAL FIBRILLATION ABLATION (N/A) as a surgical intervention.  The patient's history has been reviewed, patient examined, no change in status, stable for surgery.  I have reviewed the patient's chart and labs.  Questions were answered to the patient's satisfaction.     Nobie Putnam

## 2024-01-17 NOTE — Discharge Instructions (Addendum)
Stop amiodarone in 1 month.  Cardiac Ablation, Care After  This sheet gives you information about how to care for yourself after your procedure. Your health care provider may also give you more specific instructions. If you have problems or questions, contact your health care provider. What can I expect after the procedure? After the procedure, it is common to have: Bruising around your puncture site. Tenderness around your puncture site. Skipped heartbeats. If you had an atrial fibrillation ablation, you may have atrial fibrillation during the first several months after your procedure.  Tiredness (fatigue).  Follow these instructions at home: Puncture site care  Follow instructions from your health care provider about how to take care of your puncture site. Make sure you: If present, leave stitches (sutures), skin glue, or adhesive strips in place. These skin closures may need to stay in place for up to 2 weeks. If adhesive strip edges start to loosen and curl up, you may trim the loose edges. Do not remove adhesive strips completely unless your health care provider tells you to do that. If a large square bandage is present, this may be removed 24 hours after surgery.  Check your puncture site every day for signs of infection. Check for: Redness, swelling, or pain. Fluid or blood. If your puncture site starts to bleed, lie down on your back, apply firm pressure to the area, and contact your health care provider. Warmth. Pus or a bad smell. A pea or marble sized lump/knot at the site is normal and can take up to three months to resolve.  Driving Do not drive for at least 4 days after your procedure or however long your health care provider recommends. (Do not resume driving if you have previously been instructed not to drive for other health reasons.) Do not drive or use heavy machinery while taking prescription pain medicine. Activity Avoid activities that take a lot of effort for at  least 7 days after your procedure. Do not lift anything that is heavier than 5 lb (4.5 kg) for one week.  No sexual activity for 1 week.  Return to your normal activities as told by your health care provider. Ask your health care provider what activities are safe for you. General instructions Take over-the-counter and prescription medicines only as told by your health care provider. Do not use any products that contain nicotine or tobacco, such as cigarettes and e-cigarettes. If you need help quitting, ask your health care provider. You may shower after 24 hours, but Do not take baths, swim, or use a hot tub for 1 week.  Do not drink alcohol for 24 hours after your procedure. Keep all follow-up visits as told by your health care provider. This is important. Contact a health care provider if: You have redness, mild swelling, or pain around your puncture site. You have fluid or blood coming from your puncture site that stops after applying firm pressure to the area. Your puncture site feels warm to the touch. You have pus or a bad smell coming from your puncture site. You have a fever. You have chest pain or discomfort that spreads to your neck, jaw, or arm. You have chest pain that is worse with lying on your back or taking a deep breath. You are sweating a lot. You feel nauseous. You have a fast or irregular heartbeat. You have shortness of breath. You are dizzy or light-headed and feel the need to lie down. You have pain or numbness in the arm or  leg closest to your puncture site. Get help right away if: Your puncture site suddenly swells. Your puncture site is bleeding and the bleeding does not stop after applying firm pressure to the area. These symptoms may represent a serious problem that is an emergency. Do not wait to see if the symptoms will go away. Get medical help right away. Call your local emergency services (911 in the [place] Do not drive yourself to the  hospital. Summary After the procedure, it is normal to have bruising and tenderness at the puncture site in your groin, neck, or forearm. Check your puncture site every day for signs of infection. Get help right away if your puncture site is bleeding and the bleeding does not stop after applying firm pressure to the area. This is a medical emergency. This information is not intended to replace advice given to you by your health care provider. Make sure you discuss any questions you have with your health care provider.

## 2024-01-17 NOTE — Anesthesia Postprocedure Evaluation (Signed)
Anesthesia Post Note  Patient: Shane Gonzalez  Procedure(s) Performed: ATRIAL FIBRILLATION ABLATION     Patient location during evaluation: PACU Anesthesia Type: General Level of consciousness: sedated and patient cooperative Pain management: pain level controlled Vital Signs Assessment: post-procedure vital signs reviewed and stable Respiratory status: spontaneous breathing Cardiovascular status: stable Anesthetic complications: no   No notable events documented.  Last Vitals:  Vitals:   01/17/24 1321 01/17/24 1400  BP: 108/70 112/60  Pulse: 65 (!) 58  Resp: 19 13  Temp:    SpO2: 93% 94%    Last Pain:  Vitals:   01/17/24 1314  TempSrc: Oral  PainSc: 0-No pain                 [name]

## 2024-01-17 NOTE — Progress Notes (Signed)
Patient and wife was given discharge instructions. Both verbalized understanding.

## 2024-01-17 NOTE — Anesthesia Procedure Notes (Signed)
Procedure Name: Intubation Date/Time: 01/17/2024 10:45 AM  Performed by: April Holding, CRNAPre-anesthesia Checklist: Patient identified, Emergency Drugs available, Suction available and Patient being monitored Patient Re-evaluated:Patient Re-evaluated prior to induction Oxygen Delivery Method: Circle System Utilized Preoxygenation: Pre-oxygenation with 100% oxygen Induction Type: IV induction Ventilation: Mask ventilation without difficulty Laryngoscope Size: Glidescope and 3 Grade View: Grade III Tube type: Oral Tube size: 7.5 mm Number of attempts: 1 Airway Equipment and Method: Stylet, Oral airway and Bite block Placement Confirmation: ETT inserted through vocal cords under direct vision, positive ETCO2 and breath sounds checked- equal and bilateral Secured at: 24 cm Tube secured with: Tape Dental Injury: Teeth and Oropharynx as per pre-operative assessment

## 2024-01-17 NOTE — Transfer of Care (Signed)
Immediate Anesthesia Transfer of Care Note  Patient: Shane Gonzalez  Procedure(s) Performed: ATRIAL FIBRILLATION ABLATION  Patient Location: Cath Lab  Anesthesia Type:General  Level of Consciousness: awake, alert , and oriented  Airway & Oxygen Therapy: Patient Spontanous Breathing  Post-op Assessment: Report given to RN and Post -op Vital signs reviewed and stable  Post vital signs: Reviewed and stable  Last Vitals:  Vitals Value Taken Time  BP 110/67 01/17/24 1243  Temp 36.6 C 01/17/24 1243  Pulse 66 01/17/24 1245  Resp 18 01/17/24 1245  SpO2 93 % 01/17/24 1245  Vitals shown include unfiled device data.  Last Pain:  Vitals:   01/17/24 1243  TempSrc: Oral  PainSc: 0-No pain         Complications: No notable events documented.

## 2024-01-18 ENCOUNTER — Telehealth (HOSPITAL_COMMUNITY): Payer: Self-pay

## 2024-01-18 ENCOUNTER — Encounter (HOSPITAL_COMMUNITY): Payer: Self-pay | Admitting: Cardiology

## 2024-01-18 NOTE — Telephone Encounter (Signed)
Spoke with patient to complete post procedure follow up call.  Patient reports no complications with groin sites.   Instructions reviewed with patient:  Remove large bandage at puncture site after 24 hours. It is normal to have bruising, tenderness and a pea or marble sized lump/knot at the groin site which can take up to three months to resolve.  Get help right away if you notice sudden swelling at the puncture site.  Check your puncture site every day for signs of infection: fever, redness, swelling, pus drainage, warmth, foul odor or excessive pain. If this occurs, please call the office at [phone], to speak with the nurse. Get help right away if your puncture site is bleeding and the bleeding does not stop after applying firm pressure to the area.  You may continue to have skipped beats/ atrial fibrillation during the first several months after your procedure.  It is very important not to miss any doses of your blood thinner Xarelto. Patient restarted taking this medication on yesterday, 01/17/24.    You will follow up with the Afib clinic on 02/14/24 and with the APP 04/15/24.   Patient verbalized understanding to all instructions provided.

## 2024-01-21 MED FILL — Fentanyl Citrate Preservative Free (PF) Inj 100 MCG/2ML: INTRAMUSCULAR | Qty: 2 | Status: AC

## 2024-01-21 MED FILL — Lidocaine HCl Local Preservative Free (PF) Inj 1%: INTRAMUSCULAR | Qty: 30 | Status: AC

## 2024-01-24 ENCOUNTER — Telehealth: Payer: Self-pay

## 2024-01-24 NOTE — Telephone Encounter (Signed)
 Patient advised as below. Patient reports he is waiting on CPAP supplies. Has he will be using his wife's machine. Per note [employer] has confirmed receipt of order.

## 2024-01-24 NOTE — Telephone Encounter (Signed)
-----   Message from Erin Fulling sent at 01/23/2024  5:34 PM EST ----- Diagnosis of OSA Needs Referral to DME company [employer] [employer] [id number] MASK Please Start autoCPAP 4-14 cm h20 ----- Message ----- From: Lilian Kapur Sent: 01/18/2024   2:12 PM EST To: Erin Fulling, MD

## 2024-01-29 ENCOUNTER — Telehealth: Payer: Self-pay | Admitting: Cardiology

## 2024-01-29 MED ORDER — RIVAROXABAN 20 MG PO TABS: 20.0000 mg | ORAL_TABLET | Freq: Every day | ORAL | 11 refills | Status: DC

## 2024-01-29 NOTE — Telephone Encounter (Signed)
 Spoke with patient and he needs assistance with his xarelto. He inquired about reasons he should stay on the medication. We discussed that at length and also options for getting his medication at a cheaper price. Advised that we can do a application for assistance and that I would call him once I have all of that ready and then the documents he needs to bring in for this to be done. He verbalized understanding with no further questions at this time.

## 2024-01-29 NOTE — Telephone Encounter (Signed)
*  STAT* If patient is at the pharmacy, call can be transferred to refill team.   1. Which medications need to be refilled? (please list name of each medication and dose if known) Xarelto   2. Would you like to learn more about the convenience, safety, & potential cost savings by using the [facility]? no     3. Are you open to using the [facility] (Type [facility]. no ).   4. Which pharmacy/location (including street and city if local pharmacy) is medication to be sent to?[facility] [address],    5. Do they need a 30 day or 90 day supply? 30   Patient stated completely out.

## 2024-01-30 NOTE — Telephone Encounter (Signed)
 Form given to front office staff for patient to sign when he comes in tomorrow 01/31/24.

## 2024-01-31 ENCOUNTER — Encounter: Payer: Self-pay | Admitting: Emergency Medicine

## 2024-02-04 ENCOUNTER — Telehealth: Payer: Self-pay | Admitting: Pharmacy Technician

## 2024-02-04 ENCOUNTER — Other Ambulatory Visit (HOSPITAL_COMMUNITY): Payer: Self-pay

## 2024-02-04 NOTE — Telephone Encounter (Signed)
 I tried to run a claim to see how much his copay was but Lf 01/29/24 -02/21/24

## 2024-02-04 NOTE — Telephone Encounter (Signed)
 Hi, I received the patient form and it says he makes [phone] and has 2 people in his home. Laural Benes and [employer] requirements state the eligible requirements are that they can not make more than [phone] for a household of 2 people. Unfortunately, he is ineligible for assistance through [place]. Patient has medicare so he is unable to get a coupon. He can call the number on the back of his card and sign up for the medication payment plan.

## 2024-02-04 NOTE — Telephone Encounter (Signed)
 Application sent to med [facility] for further processing.

## 2024-02-05 NOTE — Telephone Encounter (Signed)
 I called the patient to let him know the below info

## 2024-02-14 ENCOUNTER — Ambulatory Visit: Payer: Medicare Other | Admitting: Internal Medicine

## 2024-02-14 ENCOUNTER — Ambulatory Visit (HOSPITAL_COMMUNITY)
Admission: RE | Admit: 2024-02-14 | Discharge: 2024-02-14 | Disposition: A | Discharge status: Home / Self Care | Payer: Medicare Other | Source: Ambulatory Visit | Attending: Internal Medicine | Admitting: Internal Medicine

## 2024-02-14 VITALS — BP 124/70 | HR 72 | Ht 70.0 in | Wt 250.0 lb

## 2024-02-14 DIAGNOSIS — E785 Hyperlipidemia, unspecified: Secondary | ICD-10-CM | POA: Diagnosis not present

## 2024-02-14 DIAGNOSIS — K219 Gastro-esophageal reflux disease without esophagitis: Secondary | ICD-10-CM | POA: Diagnosis not present

## 2024-02-14 DIAGNOSIS — I4819 Other persistent atrial fibrillation: Secondary | ICD-10-CM | POA: Insufficient documentation

## 2024-02-14 DIAGNOSIS — Z7901 Long term (current) use of anticoagulants: Secondary | ICD-10-CM | POA: Insufficient documentation

## 2024-02-14 DIAGNOSIS — Z79899 Other long term (current) drug therapy: Secondary | ICD-10-CM | POA: Diagnosis not present

## 2024-02-14 DIAGNOSIS — Z91148 Patient's other noncompliance with medication regimen for other reason: Secondary | ICD-10-CM | POA: Diagnosis not present

## 2024-02-14 NOTE — Progress Notes (Signed)
 Primary Care Physician: Tower, Audrie Gallus, MD Primary Cardiologist: Reatha Harps, MD Electrophysiologist: Nobie Putnam, MD     Referring Physician: Dr. Mosetta Putt Bostwick is a 68 y.o. male with a history of HLD, GERD, and persistent atrial fibrillation who presents for consultation in the [facility]. Patient is on Xarelto 20 mg daily for a CHADS2VASC score of 1.  On evaluation today 02/14/24, patient is currently in NSR. S/p Afib ablation on 01/17/24 by Dr. Jimmey Ralph. No episodes of Afib since ablation. He stopped both amiodarone and metoprolol when he ran out of medications. He wears an Apple watch and has not noted any Afib since ablation. He walked 3 miles yesterday without issue. No chest pain or SOB. Leg sites healed without issue. No missed doses of Xarelto 20 mg daily.   Today, he denies symptoms of orthopnea, PND, lower extremity edema, dizziness, presyncope, syncope, snoring, daytime somnolence, bleeding, or neurologic sequela. The patient is tolerating medications without difficulties and is otherwise without complaint today.    he has a BMI of Body mass index is 35.87 kg/m.Marland Kitchen Filed Weights   02/14/24 1305  Weight: 113.4 kg    Current Outpatient Medications  Medication Sig Dispense Refill   atorvastatin (LIPITOR) 40 MG tablet Take 1 tablet (40 mg total) by mouth daily. 90 tablet 3   rivaroxaban (XARELTO) 20 MG TABS tablet Take 1 tablet (20 mg total) by mouth daily with supper. 30 tablet 11   sildenafil (VIAGRA) 50 MG tablet Take 1 tablet (50 mg total) by mouth daily as needed for erectile dysfunction. 10 tablet 0   No current facility-administered medications for this encounter.    Atrial Fibrillation Management history:  Previous antiarrhythmic drugs: amiodarone Previous cardioversions: 12/21/23 Previous ablations: 01/17/24 Anticoagulation history: Xarelto 20 mg    ROS- All systems are reviewed and negative except as per the HPI  above.  Physical Exam: BP 124/70   Pulse 72   Ht 5\' 10"  (1.778 m)   Wt 113.4 kg   BMI 35.87 kg/m   GEN: Well nourished, well developed in no acute distress NECK: No JVD; No carotid bruits CARDIAC: Regular rate and rhythm, no murmurs, rubs, gallops RESPIRATORY:  Clear to auscultation without rales, wheezing or rhonchi  ABDOMEN: Soft, non-tender, non-distended EXTREMITIES:  No edema; No deformity   EKG today demonstrates  Vent. rate 72 BPM PR interval 200 ms QRS duration 98 ms QT/QTcB 408/446 ms P-R-T axes 80 37 27 Normal sinus rhythm Normal ECG When compared with ECG of 17-Jan-2024 12:53, PREVIOUS ECG IS PRESENT  Echo 12/04/23 demonstrated  1. Left ventricular ejection fraction, by estimation, is 60 to 65%. The  left ventricle has normal function. The left ventricle has no regional  wall motion abnormalities. Left ventricular diastolic parameters are  indeterminate.   2. Right ventricular systolic function is normal. The right ventricular  size is normal.   3. The mitral valve is normal in structure. Mild mitral valve  regurgitation.   4. The aortic valve was not well visualized. Aortic valve regurgitation  is not visualized. Aortic valve sclerosis is present, with no evidence of  aortic valve stenosis.   5. The inferior vena cava is normal in size with greater than 50%  respiratory variability, suggesting right atrial pressure of 3 mmHg.   ASSESSMENT & PLAN CHA2DS2-VASc Score = 1  The patient's score is based upon: CHF History: 0 HTN History: 0 Diabetes History: 0 Stroke History: 0 Vascular  Disease History: 0 Age Score: 1 Gender Score: 0       ASSESSMENT AND PLAN: Persistent Atrial Fibrillation (ICD10:  I48.19) The patient's CHA2DS2-VASc score is 1, indicating a 0.6% annual risk of stroke.   S/p Afib ablation on 01/17/24 by Dr. Jimmey Ralph.  He is currently in NSR. He already stopped amiodarone and stopped metoprolol because he ran out of medication. At this  time, he declines to restart metoprolol.  Continue Xarelto 20 mg daily without interruption.    Follow up as scheduled with EP.    Lake Bells, PA-C  Afib Clinic [facility] [address] [place], [place] [address] [phone]

## 2024-02-24 ENCOUNTER — Encounter: Payer: Self-pay | Admitting: Cardiology

## 2024-02-24 DIAGNOSIS — I4819 Other persistent atrial fibrillation: Secondary | ICD-10-CM

## 2024-02-25 MED ORDER — RIVAROXABAN 20 MG PO TABS: 20.0000 mg | ORAL_TABLET | Freq: Every day | ORAL | 5 refills | Status: DC

## 2024-02-25 NOTE — Telephone Encounter (Signed)
 Prescription refill request for Xarelto received.  Indication: Afib  Last office visit: 02/14/24 Shane Gonzalez)  Weight: 113.4kg Age: 68 Scr: 0.89 (12/18/23)  CrCl: 130.72ml/min  Appropriate dose. Refill sent.

## 2024-02-26 NOTE — Progress Notes (Unsigned)
   Anthonny Schiller T. Bryse Blanchette, MD, CAQ Sports Medicine [facility] [address] [place], [address]  Phone: [phone]  FAX: [phone]  Shane Gonzalez - 68 y.o. male  MRN [id number]  Date of Birth: December 13, 1955  Date: 02/27/2024  PCP: Judy Pimple, MD  Referral: Judy Pimple, MD  No chief complaint on file.  Subjective:   Shane Gonzalez is a 68 y.o. very pleasant male patient with There is no height or weight on file to calculate BMI. who presents with the following:  Is a very nice patient who presents with some ongoing left-sided foot and ankle pain and swelling.  I have seen him previously once for some plantar fasciitis.  His daughter is one of our partners, and she suggested that he come in and see me to talk about his ankle.    Review of Systems is noted in the HPI, as appropriate  Objective:   There were no vitals taken for this visit.  GEN: No acute distress; alert,appropriate. PULM: Breathing comfortably in no respiratory distress PSYCH: Normally interactive.   Laboratory and Imaging Data:  Assessment and Plan:   ***

## 2024-02-27 ENCOUNTER — Ambulatory Visit (INDEPENDENT_AMBULATORY_CARE_PROVIDER_SITE_OTHER): Admitting: Family Medicine

## 2024-02-27 ENCOUNTER — Ambulatory Visit (INDEPENDENT_AMBULATORY_CARE_PROVIDER_SITE_OTHER)
Admission: RE | Admit: 2024-02-27 | Discharge: 2024-02-27 | Disposition: A | Discharge status: Home / Self Care | Source: Ambulatory Visit | Attending: Family Medicine | Admitting: Family Medicine

## 2024-02-27 ENCOUNTER — Encounter: Payer: Self-pay | Admitting: Family Medicine

## 2024-02-27 VITALS — BP 120/82 | HR 84 | Temp 98.0°F | Ht 70.0 in | Wt 250.5 lb

## 2024-02-27 DIAGNOSIS — M25572 Pain in left ankle and joints of left foot: Secondary | ICD-10-CM

## 2024-02-27 DIAGNOSIS — M85872 Other specified disorders of bone density and structure, left ankle and foot: Secondary | ICD-10-CM | POA: Diagnosis not present

## 2024-02-27 DIAGNOSIS — M7732 Calcaneal spur, left foot: Secondary | ICD-10-CM | POA: Diagnosis not present

## 2024-02-27 DIAGNOSIS — M19072 Primary osteoarthritis, left ankle and foot: Secondary | ICD-10-CM | POA: Diagnosis not present

## 2024-02-28 ENCOUNTER — Encounter: Payer: Self-pay | Admitting: Family Medicine

## 2024-03-06 DIAGNOSIS — L918 Other hypertrophic disorders of the skin: Secondary | ICD-10-CM | POA: Diagnosis not present

## 2024-03-06 DIAGNOSIS — L570 Actinic keratosis: Secondary | ICD-10-CM | POA: Diagnosis not present

## 2024-03-06 DIAGNOSIS — C4359 Malignant melanoma of other part of trunk: Secondary | ICD-10-CM | POA: Diagnosis not present

## 2024-03-06 DIAGNOSIS — L814 Other melanin hyperpigmentation: Secondary | ICD-10-CM | POA: Diagnosis not present

## 2024-03-06 DIAGNOSIS — D485 Neoplasm of uncertain behavior of skin: Secondary | ICD-10-CM | POA: Diagnosis not present

## 2024-03-06 DIAGNOSIS — D224 Melanocytic nevi of scalp and neck: Secondary | ICD-10-CM | POA: Diagnosis not present

## 2024-03-06 DIAGNOSIS — D2239 Melanocytic nevi of other parts of face: Secondary | ICD-10-CM | POA: Diagnosis not present

## 2024-03-06 DIAGNOSIS — D225 Melanocytic nevi of trunk: Secondary | ICD-10-CM | POA: Diagnosis not present

## 2024-03-06 DIAGNOSIS — D2261 Melanocytic nevi of right upper limb, including shoulder: Secondary | ICD-10-CM | POA: Diagnosis not present

## 2024-03-06 DIAGNOSIS — L821 Other seborrheic keratosis: Secondary | ICD-10-CM | POA: Diagnosis not present

## 2024-03-13 DIAGNOSIS — C4359 Malignant melanoma of other part of trunk: Secondary | ICD-10-CM | POA: Diagnosis not present

## 2024-03-13 DIAGNOSIS — L988 Other specified disorders of the skin and subcutaneous tissue: Secondary | ICD-10-CM | POA: Diagnosis not present

## 2024-03-24 NOTE — Telephone Encounter (Signed)
 Pt would like someone to call [employer] and find out why he cannot get his mask.  This has been going on way too long.  Pt cancelled his appt for tomorrow b/c there is no need to come in since he has not used CPAP.

## 2024-03-25 ENCOUNTER — Ambulatory Visit: Admitting: Internal Medicine

## 2024-03-25 NOTE — Progress Notes (Unsigned)
     Hema Lanza T. Miciah Covelli, MD, CAQ Sports Medicine [facility] at [facility] [address] [place], [address]  Phone: [phone]  FAX: [phone]  Sachin Ferencz - 68 y.o. male  MRN [id number]  Date of Birth: 1956/09/15  Date: 03/26/2024  PCP: Clemens Curt, MD  Referral: Clemens Curt, MD  No chief complaint on file.  Subjective:   Abhijay Morriss is a 68 y.o. very pleasant male patient with There is no height or weight on file to calculate BMI. who presents with the following:  Brodrick is a very nice patient who I remember quite well.  I saw him about 1 month ago with some ongoing left-sided ankle pain.  Given his pain at the lateral ankle and disability, did place him in a short cam walker boot and anticipated him to wear this for 1 month.    Review of Systems is noted in the HPI, as appropriate  Objective:   There were no vitals taken for this visit.  GEN: No acute distress; alert,appropriate. PULM: Breathing comfortably in no respiratory distress PSYCH: Normally interactive.   Laboratory and Imaging Data:  Assessment and Plan:   ***

## 2024-03-25 NOTE — Progress Notes (Deleted)
 Name: Ahmar Pickrell MRN: [id number] DOB: 04-14-1956    HST January 2025 AHI 4% 29 AHI 3% 48 Percent saturation and lower for 25 minutes 173 desaturation episodes Average apnea duration 17 seconds     CHIEF COMPLAINT:  Follow-up assessment for OSA   HISTORY OF PRESENT ILLNESS: Discussed sleep data and reviewed with patient.  Patient with moderate to severe sleep apnea based on AHI  Patient was referred to us  to assess for underlying sleep apnea Patient currently weighs 240 pounds 5 foot 10 inches  Works as a Marketing executive cigars intermittently Occasional alcohol use     Cardiac history Patient was first diagnosed with atrial fibrillation related to a COVID infection in 2021.  He did well for many years and then was found to have an irregular heart rate by home health nurse in late 2024. Patient present with  persistent atrial fibrillation was referred to cardiology Cardiology placed patient on anticoagulation and plan for electrical cardioversion in the next several days Patient is plan to have ablation therapy in February Symptomatic persistent atrial fibrillation: -Start dronedarone  400mg  BID as a bridge to ablation. Continue low dose metoprolol .       PAST MEDICAL HISTORY :   has a past medical history of Aortic valve sclerosis, Bilateral bunions, Blood in stool, Diverticulosis, Erectile dysfunction, Fatty liver (12/13/2019), GERD (gastroesophageal reflux disease), Hyperlipidemia, PAF (paroxysmal atrial fibrillation) (HCC), Toe deformity, and Toenail fungus.  has a past surgical history that includes Anterior cruciate ligament repair; Shoulder surgery; Closed reduction shoulder dislocation; Wisdom tooth extraction (2011); Bunionectomy (2011); hammer toe repair (2011); Tendon repair (2011); Refractive surgery (2009); Vasectomy; Arthroscopic repair ACL; Arthroscopic repair PCL; right shoulder ; Bunionectomy (2011); Vasectomy; CARDIOVERSION (N/A, 12/21/2023); and ATRIAL  FIBRILLATION ABLATION (N/A, 01/17/2024). Prior to Admission medications   Medication Sig Start Date End Date Taking? Authorizing Provider  amiodarone  (PACERONE ) 200 MG tablet Take 2 tablets (400 mg total) by mouth 2 (two) times daily for 7 days, THEN 2 tablets (400 mg total) daily for 7 days, THEN 1 tablet (200 mg total) daily. 12/19/23 02/01/24  Ardeen Kohler, MD  ibuprofen (ADVIL) 200 MG tablet Take 400 mg by mouth every 6 (six) hours as needed for moderate pain (pain score 4-6).    [provider]  metoprolol  succinate (TOPROL  XL) 25 MG 24 hr tablet Take 0.5 tablets (12.5 mg total) by mouth daily. 11/12/23   Florette Hurry, NP  rivaroxaban  (XARELTO ) 20 MG TABS tablet Take 1 tablet (20 mg total) by mouth daily with supper. 10/29/23   Tower, Manley Seeds, MD  sildenafil  (VIAGRA ) 50 MG tablet Take 1 tablet (50 mg total) by mouth daily as needed for erectile dysfunction. 06/28/23   Tower, Manley Seeds, MD   No Known Allergies  FAMILY HISTORY:  family history includes Alcohol abuse in his brother and brother; Cancer in his brother, father, mother, paternal aunt, and paternal uncle; Diverticulitis in his brother, brother, and sister; Heart disease in his maternal grandfather; Hypertension in his father; Prostate cancer in his brother; Stroke in his mother; Vision loss in his father and mother. SOCIAL HISTORY:  reports that he has quit smoking. His smoking use included cigars. He has never used smokeless tobacco. He reports current alcohol use of about 8.0 standard drinks of alcohol per week. He reports that he does not use drugs.      Review of Systems: Gen:  Denies  fever, sweats, chills weight loss  HEENT: Denies blurred vision, double vision, ear pain, eye  pain, hearing loss, nose bleeds, sore throat Cardiac:  No dizziness, chest pain or heaviness, chest tightness,edema, No JVD Resp:   No cough, -sputum production, -shortness of breath,-wheezing, -hemoptysis,  Other:  All other systems  negative   Physical Examination:   General Appearance: No distress  EYES PERRLA, EOM intact.   NECK Supple, No JVD Pulmonary: normal breath sounds, No wheezing.  CardiovascularNormal S1,S2.  No m/r/g.   Abdomen: Benign, Soft, non-tender. Neurology UE/LE 5/5 strength, no focal deficits Ext pulses intact, cap refill intact ALL OTHER ROS ARE NEGATIVE        Echocardiogram December 2024 Ejection fraction 60%  CT chest January 2021 Reviewed in detail independently today Bilateral interstitial opacities multifocal History of COVID-19 infection  CBC    Component Value Date/Time   WBC 4.6 12/18/2023 0843   WBC 4.7 10/29/2023 1555   RBC 4.75 12/18/2023 0843   RBC 4.52 10/29/2023 1555   HGB 15.1 12/18/2023 0843   HCT 45.2 12/18/2023 0843   PLT 214 12/18/2023 0843   MCV 95 12/18/2023 0843   MCH 31.8 12/18/2023 0843   MCH 32.9 11/02/2020 0918   MCHC 33.4 12/18/2023 0843   MCHC 34.1 10/29/2023 1555   RDW 11.9 12/18/2023 0843   LYMPHSABS 1.4 10/29/2023 1555   MONOABS 0.5 10/29/2023 1555   EOSABS 0.1 10/29/2023 1555   BASOSABS 0.0 10/29/2023 1555      Latest Ref Rng & Units 01/17/2024    8:14 AM 12/18/2023    8:43 AM 10/29/2023    3:55 PM  BMP  Glucose 70 - 99 mg/dL 98  92  75   BUN 8 - 23 mg/dL 13  17  21    Creatinine 0.61 - 1.24 mg/dL 1.61  0.96  0.45   BUN/Creat Ratio 10 - 24  19    Sodium 135 - 145 mmol/L 139  143  139   Potassium 3.5 - 5.1 mmol/L 4.4  5.5  4.9   Chloride 98 - 111 mmol/L 104  105  103   CO2 22 - 32 mmol/L 23  22  27    Calcium  8.9 - 10.3 mg/dL 9.3  40.9  9.8      ASSESSMENT AND PLAN SYNOPSIS 68 year old pleasant white male seen today for assessment of moderate to severe OSA in the setting of obesity and deconditioned state with underlying atrial fibrillation   Recommend Sleep Study for definitve diagnosis And home sleep study to assess for OSA  Obesity -recommend significant weight loss -recommend changing diet  Deconditioned  state -Recommend increased daily activity and exercise   Persistent A-fib S/p  electrical cardioversion S/p  ablation therapy  Continue anticoagulation as prescribed Follow-up with cardiology     Patient Instructions  Be aware of reduced alertness and do not drive or operate heavy machinery if experiencing this or drowsiness.  Exercise encouraged, as tolerated. Encouraged proper weight management.  Important to get eight or more hours of sleep  Limiting the use of the computer and television before bedtime.  Decrease naps during the day, so night time sleep will become enhanced.  Limit caffeine, and sleep deprivation.  HTN, stroke, uncontrolled diabetes and heart failure are potential risk factors.  Risk of untreated sleep apnea including cardiac arrhthymias, stroke, DM, pulm HTN.    MEDICATION ADJUSTMENTS/LABS AND TESTS ORDERED:    CURRENT MEDICATIONS REVIEWED AT LENGTH WITH PATIENT TODAY   Patient  satisfied with Plan of action and management. All questions answered   Follow up 6 months  I spent a total of 45 minutes reviewing chart data, face-to-face evaluation with the patient, counseling and coordination of care as detailed above.      Lady Pier, M.D.  [facility]  Medical Director [facility] Medical Director [facility] Cardio-Pulmonary Department

## 2024-03-26 ENCOUNTER — Encounter: Payer: Self-pay | Admitting: Family Medicine

## 2024-03-26 ENCOUNTER — Ambulatory Visit (INDEPENDENT_AMBULATORY_CARE_PROVIDER_SITE_OTHER): Admitting: Family Medicine

## 2024-03-26 VITALS — BP 114/76 | HR 71 | Temp 98.2°F | Ht 70.0 in | Wt 253.0 lb

## 2024-03-26 DIAGNOSIS — M25572 Pain in left ankle and joints of left foot: Secondary | ICD-10-CM | POA: Diagnosis not present

## 2024-04-09 DIAGNOSIS — H40013 Open angle with borderline findings, low risk, bilateral: Secondary | ICD-10-CM | POA: Diagnosis not present

## 2024-04-09 DIAGNOSIS — H25813 Combined forms of age-related cataract, bilateral: Secondary | ICD-10-CM | POA: Diagnosis not present

## 2024-04-09 DIAGNOSIS — H11151 Pinguecula, right eye: Secondary | ICD-10-CM | POA: Diagnosis not present

## 2024-04-09 DIAGNOSIS — H02831 Dermatochalasis of right upper eyelid: Secondary | ICD-10-CM | POA: Diagnosis not present

## 2024-04-09 DIAGNOSIS — H524 Presbyopia: Secondary | ICD-10-CM | POA: Diagnosis not present

## 2024-04-13 NOTE — Progress Notes (Unsigned)
 Electrophysiology Office Note:   Date:  04/15/2024  ID:  Shane Gonzalez, DOB Jun 18, 1956, MRN [id number]  Primary Cardiologist: Oneil Bigness, MD Primary Heart Failure: None Electrophysiologist: Ardeen Kohler, MD      History of Present Illness:   Shane Gonzalez is a 68 y.o. male with h/o AF, HLD, GERD seen today for routine electrophysiology followup.   Since last being seen in our clinic the patient reports doing well overall.  He continues to work on cars.  He notes that he called [facility] and was told he did not have any refills on his Xarelto  and has been off of it for approximately 1.5 weeks.  He has not had any episodes of atrial fibrillation.  He is typically unaware of AF when it does occur.  He has an Scientist, physiological that he monitors his heart rhythm daily and has not had any A-fib.  He denies chest pain, palpitations, dyspnea, PND, orthopnea, nausea, vomiting, dizziness, syncope, edema, weight gain, or early satiety.   Review of systems complete and found to be negative unless listed in HPI.   EP Information / Studies Reviewed:    EKG is ordered today. Personal review as below.  EKG Interpretation Date/Time:  Tuesday Apr 15 2024 14:41:56 EDT Ventricular Rate:  90 PR Interval:  180 QRS Duration:  92 QT Interval:  382 QTC Calculation: 467 R Axis:   21  Text Interpretation: Normal sinus rhythm Confirmed by Creighton Doffing ([id number]) on 04/15/2024 3:04:24 PM   Studies:  ECHO 11/2023 > LVEF 60-65%, mild MV regurgitation  CT Cardiac Morphology 12/25/23 > normal PV drainage into the LA, CAC score of 153 / 63rd percentile for matched controls  EPS 01/17/24 > successful PVI, ablation/isolation of posterior wall  Arrhythmia / AAD AF > initial dx 2021 after COVID DCCV 12/21/23  Amiodarone  > initiated 12/2023 > stopped 02/2024 post ablation   Risk Assessment/Calculations:    CHA2DS2-VASc Score = 1   This indicates a 0.6% annual risk of stroke. The patient's score is based upon: CHF History:  0 HTN History: 0 Diabetes History: 0 Stroke History: 0 Vascular Disease History: 0 Age Score: 1 Gender Score: 0         STOP-Bang Score:  5       Physical Exam:   VS:  BP 122/70   Pulse 90   Ht 5\' 10"  (1.778 m)   Wt 249 lb (112.9 kg)   SpO2 93%   BMI 35.73 kg/m    Wt Readings from Last 3 Encounters:  04/15/24 249 lb (112.9 kg)  03/26/24 253 lb (114.8 kg)  02/27/24 250 lb 8 oz (113.6 kg)     GEN: pleasant, well nourished, well developed in no acute distress NECK: No JVD; No carotid bruits CARDIAC: Regular rate and rhythm, no murmurs, rubs, gallops RESPIRATORY:  Clear to auscultation without rales, wheezing or rhonchi  ABDOMEN: Soft, non-tender, non-distended EXTREMITIES:  No edema; No deformity   ASSESSMENT AND PLAN:    Persistent Atrial Fibrillation  CHA2DS2-VASc 1, s/p ablation 01/17/24 - Reviewed annual risk of stroke with CHA2DS2-VASc of 1, discussed that patient is in a gray zone in terms of guidelines regarding anticoagulation with his low risk of stroke.  He indicates he would prefer to come off anticoagulation if possible due to his work.  We discussed ways of monitoring for atrial fibrillation and the importance of wearing his watch continuously.  Also discussed loop recorder if he wants a more precise way of monitoring.  The patient  elects to stop anticoagulation at this time and feels comfortable with notifying the clinic if he has recurrent AF.  Plan of care reviewed with Dr. Daneil Dunker in clinic. -no AF burden since ablation    -monitors with an Apple Watch -reviewed modifiable risk factors with patient to include daily exercise, 10% weight reduction, treating CPAP and limiting alcohol intake  Secondary Hypercoagulable State  -stop anticoagulation as above  Moderate OSA  Home sleep study 12/2023 with moderate OSA -patient completed a sleep study and is pending receiving his CPAP machine > he reports there has been a delay in getting it from the company   Follow  up with Dr. Daneil Dunker or EP APP in 3 months to review for recurrence of AF.  Signed, Creighton Doffing, NP-C, AGACNP-BC [facility] - Electrophysiology  04/15/2024, 4:06 PM

## 2024-04-15 ENCOUNTER — Ambulatory Visit: Payer: Medicare Other | Attending: Pulmonary Disease | Admitting: Pulmonary Disease

## 2024-04-15 ENCOUNTER — Encounter: Payer: Self-pay | Admitting: Pulmonary Disease

## 2024-04-15 VITALS — BP 122/70 | HR 90 | Ht 70.0 in | Wt 249.0 lb

## 2024-04-15 DIAGNOSIS — G4730 Sleep apnea, unspecified: Secondary | ICD-10-CM | POA: Diagnosis not present

## 2024-04-15 DIAGNOSIS — I4819 Other persistent atrial fibrillation: Secondary | ICD-10-CM | POA: Diagnosis not present

## 2024-04-15 DIAGNOSIS — D6869 Other thrombophilia: Secondary | ICD-10-CM | POA: Diagnosis not present

## 2024-04-15 NOTE — Patient Instructions (Addendum)
 Medication Instructions:  Stop xarelto  *If you need a refill on your cardiac medications before your next appointment, please call your pharmacy*  Lab Work: None ordered If you have labs (blood work) drawn today and your tests are completely normal, you will receive your results only by: MyChart Message (if you have MyChart) OR A paper copy in the mail If you have any lab test that is abnormal or we need to change your treatment, we will call you to review the results.  Follow-Up: At [facility], you and your health needs are our priority.  As part of our continuing mission to provide you with exceptional heart care, our providers are all part of one team.  This team includes your primary Cardiologist (physician) and Advanced Practice Providers or APPs (Physician Assistants and Nurse Practitioners) who all work together to provide you with the care you need, when you need it.  Your next appointment:   3 month(s)  Provider:   Creighton Doffing, NP

## 2024-05-17 ENCOUNTER — Encounter: Payer: Self-pay | Admitting: Internal Medicine

## 2024-05-19 NOTE — Telephone Encounter (Signed)
 I have sent an urgent message to [employer] about this issue

## 2024-05-19 NOTE — Telephone Encounter (Signed)
 Order was placed in Feb. Shane Gonzalez can you check on this?

## 2024-05-20 NOTE — Telephone Encounter (Signed)
 I received a message from [place] with [employer]   I sent a message to resupply and Branch to contact patient. Per last note the settings were completed and pateint was to get supplies but I don't see that the supply order was completed after the Branch completed the settings review and air view setup.  Patient was a new TOC and supplies should have been completed with the settings change.  If I hear anything is needed I will let you know.

## 2024-06-03 ENCOUNTER — Ambulatory Visit (INDEPENDENT_AMBULATORY_CARE_PROVIDER_SITE_OTHER): Payer: Medicare Other

## 2024-06-03 VITALS — Ht 70.0 in | Wt 249.0 lb

## 2024-06-03 DIAGNOSIS — Z0000 Encounter for general adult medical examination without abnormal findings: Secondary | ICD-10-CM | POA: Diagnosis not present

## 2024-06-03 NOTE — Progress Notes (Signed)
 Subjective:   Shane Gonzalez is a 68 y.o. who presents for a Medicare Wellness preventive visit.  As a reminder, Annual Wellness Visits don't include a physical exam, and some assessments may be limited, especially if this visit is performed virtually. We may recommend an in-person follow-up visit with your provider if needed.  Visit Complete: Virtual I connected with  Shane Gonzalez on 06/03/24 by a audio enabled telemedicine application and verified that I am speaking with the correct person using two identifiers.  Patient Location: Home  Provider Location: Office/Clinic  I discussed the limitations of evaluation and management by telemedicine. The patient expressed understanding and agreed to proceed.  Vital Signs: Because this visit was a virtual/telehealth visit, some criteria may be missing or patient reported. Any vitals not documented were not able to be obtained and vitals that have been documented are patient reported.  VideoDeclined- This patient declined Librarian, academic. Therefore the visit was completed with audio only.  Persons Participating in Visit: Patient.  AWV Questionnaire: No: Patient Medicare AWV questionnaire was not completed prior to this visit.  Cardiac Risk Factors include: advanced age (>46men, >21 women);male gender;dyslipidemia;obesity (BMI >30kg/m2)     Objective:    Today's Vitals   06/03/24 1136  Weight: 249 lb (112.9 kg)  Height: 5' 10 (1.778 m)   Body mass index is 35.73 kg/m.     06/03/2024   11:48 AM 01/17/2024    8:11 AM 12/21/2023    7:54 AM 12/11/2019    7:01 PM 07/06/2015    8:03 AM  Advanced Directives  Does Patient Have a Medical Advance Directive? No No No No No   Would patient like information on creating a medical advance directive?  No - Patient declined  No - Patient declined No - patient declined information      Data saved with a previous flowsheet row definition    Current Medications  (verified) Outpatient Encounter Medications as of 06/03/2024  Medication Sig   atorvastatin  (LIPITOR) 40 MG tablet Take 1 tablet (40 mg total) by mouth daily.   sildenafil  (VIAGRA ) 50 MG tablet Take 1 tablet (50 mg total) by mouth daily as needed for erectile dysfunction.   No facility-administered encounter medications on file as of 06/03/2024.    Allergies (verified) Patient has no known allergies.   History: Past Medical History:  Diagnosis Date   Aortic valve sclerosis    a. 12/2019 Echo: EF of 60 to 65% with moderate LVH, no wall motion abnormalities, normal RV size and function, trivial MR, and mild to moderate aortic sclerosis without stenosis   Bilateral bunions    both feet   Blood in stool    Hx of   Diverticulosis    Hx of   Erectile dysfunction    Fatty liver 12/13/2019   GERD (gastroesophageal reflux disease)    Hyperlipidemia    PAF (paroxysmal atrial fibrillation) (HCC)    a. 12/2019 Dx in setting of COVID-->placed on Xarelto -->converted on his own prior to DCCV and Xarelto  d/c'd; b. 10/2023 Incidentally noted to be back in Afib-->Xarelto  (CHA2DS2VASc = 1).   Toe deformity    2nd   Toenail fungus    rt big toe   Past Surgical History:  Procedure Laterality Date   ANTERIOR CRUCIATE LIGAMENT REPAIR     ARTHROSCOPIC REPAIR ACL     ARTHROSCOPIC REPAIR PCL     ATRIAL FIBRILLATION ABLATION N/A 01/17/2024   Procedure: ATRIAL FIBRILLATION ABLATION;  Surgeon: Kennyth Chew,  MD;  Location: [facility] INVASIVE CV LAB;  Service: Cardiovascular;  Laterality: N/A;   BUNIONECTOMY  2011   BUNIONECTOMY  2011   CARDIOVERSION N/A 12/21/2023   Procedure: CARDIOVERSION;  Surgeon: Okey Vina GAILS, MD;  Location: [facility] INVASIVE CV LAB;  Service: Cardiovascular;  Laterality: N/A;   CLOSED REDUCTION SHOULDER DISLOCATION     left reduced under anesthesia   hammer toe repair  2011   REFRACTIVE SURGERY  2009   right shoulder      SHOULDER SURGERY     right   TENDON REPAIR  2011   VASECTOMY      VASECTOMY     WISDOM TOOTH EXTRACTION  2011   Family History  Problem Relation Age of Onset   Cancer Mother        ? pancreatic   Stroke Mother    Vision loss Mother    Cancer Father        lung ca   Hypertension Father    Vision loss Father    Diverticulitis Sister    Prostate cancer Brother    Alcohol abuse Brother    Cancer Brother    Diverticulitis Brother        x2 brothers   Heart disease Maternal Grandfather    Alcohol abuse Brother    Diverticulitis Brother    Cancer Paternal Aunt    Cancer Paternal Uncle    Social History   Socioeconomic History   Marital status: Married    Spouse name: Not on file   Number of children: 2   Years of education: Not on file   Highest education level: Associate degree: occupational, Scientist, product/process development, or vocational program  Occupational History   Occupation: Airline pilot Rep  Tobacco Use   Smoking status: Former    Types: Cigars   Smokeless tobacco: Never   Tobacco comments:    only smokes once yearly  Vaping Use   Vaping status: Never Used  Substance and Sexual Activity   Alcohol use: Yes    Alcohol/week: 8.0 standard drinks of alcohol    Types: 8 Cans of beer per week    Comment: 8-10 beers on the weekends   Drug use: No   Sexual activity: Yes    Birth control/protection: Surgical  Other Topics Concern   Not on file  Social History Narrative   Tobacco use, amount per day now: None.   Past tobacco use, amount per day:   How many years did you use tobacco:   Alcohol use (drinks per week): 4 occasionally.    Diet:   Do you drink/eat things with caffeine: Tea   Marital status: Married                                   What year were you married? 1985   Do you live in a house, apartment, assisted living, condo, trailer, etc.? House.   Is it one or more stories? One   How many persons live in your home? 2   Do you have pets in your home?( please list) 1 Dog,1 Cat , and 8 Chickens.   Highest Level of education completed? GED    Current or past profession: Data processing manager.    Do you exercise? Yes  Type and how often? Walk, play golf, lift weights, and stationary bike.    Do you have a living will? No   Do you have a DNR form? No                                  If not, do you want to discuss one?   Do you have signed POA/HPOA forms? No                       If so, please bring to you appointment      Do you have any difficulty bathing or dressing yourself? No   Do you have any difficulty preparing food or eating? No   Do you have any difficulty managing your medications? No   Do you have any difficulty managing your finances? No   Do you have any difficulty affording your medications? No   Social Drivers of Corporate investment banker Strain: Low Risk  (06/03/2024)   Overall Financial Resource Strain (CARDIA)    Difficulty of Paying Living Expenses: Not hard at all  Food Insecurity: No Food Insecurity (06/03/2024)   Hunger Vital Sign    Worried About Running Out of Food in the Last Year: Never true    Ran Out of Food in the Last Year: Never true  Transportation Needs: No Transportation Needs (06/03/2024)   PRAPARE - Administrator, Civil Service (Medical): No    Lack of Transportation (Non-Medical): No  Physical Activity: Insufficiently Active (06/03/2024)   Exercise Vital Sign    Days of Exercise per Week: 3 days    Minutes of Exercise per Session: 30 min  Stress: No Stress Concern Present (06/03/2024)   [employer] of Occupational Health - Occupational Stress Questionnaire    Feeling of Stress: Not at all  Social Connections: Socially Integrated (06/03/2024)   Social Connection and Isolation Panel    Frequency of Communication with Friends and Family: More than three times a week    Frequency of Social Gatherings with Friends and Family: Twice a week    Attends Religious Services: More than 4 times per year    Active Member of [employer] or Organizations: Yes     Attends Banker Meetings: 1 to 4 times per year    Marital Status: Married    Tobacco Counseling Counseling given: Not Answered Tobacco comments: only smokes once yearly    Clinical Intake:  Pre-visit preparation completed: Yes  Pain : No/denies pain     BMI - recorded: 35.73 Nutritional Status: BMI > 30  Obese Nutritional Risks: None Diabetes: No  Lab Results  Component Value Date   HGBA1C 5.5 06/11/2023   HGBA1C 5.3 06/29/2020     How often do you need to have someone help you when you read instructions, pamphlets, or other written materials from your doctor or pharmacy?: 1 - Never  Interpreter Needed?: No  Comments: lives with wife Information entered by :: B.Dewon Mendizabal,LPN   Activities of Daily Living     06/03/2024   11:48 AM 12/21/2023    7:51 AM  In your present state of health, do you have any difficulty performing the following activities:  Hearing? 0 0  Vision? 0 0  Difficulty concentrating or making decisions? 0 0  Walking or climbing stairs? 0   Dressing or bathing? 0   Doing errands, shopping? 0  Preparing Food and eating ? N   Using the Toilet? N   In the past six months, have you accidently leaked urine? N   Do you have problems with loss of bowel control? N   Managing your Medications? N   Managing your Finances? N   Housekeeping or managing your Housekeeping? N     Patient Care Team: Tower, Laine LABOR, MD as PCP - General (Family Medicine) O'Neal, Darryle Ned, MD as PCP - Cardiology (Cardiology) Kennyth Chew, MD as PCP - Electrophysiology (Cardiology) Pa, [facility]  I have updated your Care Teams any recent Medical Services you may have received from other providers in the past year.     Assessment:   This is a routine wellness examination for Pancho.  Hearing/Vision screen Hearing Screening - Comments:: Pt says her hearing is good Vision Screening - Comments:: Pt says his vision is good w/glasses [employer]   Goals Addressed             This Visit's Progress    Weight (lb) < 200 lb (90.7 kg)   249 lb (112.9 kg)    06/03/24-lose 20lbs (staying away from potatoes and bread), drinking 4-5 bottles day       Depression Screen     06/03/2024   11:42 AM 06/20/2023    9:27 AM 11/15/2021   10:14 AM 11/02/2020    8:27 AM 06/28/2020    3:22 PM 02/12/2019   10:48 AM 02/08/2018   10:20 AM  PHQ 2/9 Scores  PHQ - 2 Score 0 0 0 0 0 0 0  PHQ- 9 Score  0 0 0 0  0    Fall Risk     06/03/2024   11:39 AM 06/20/2023    9:24 AM 11/02/2020    8:27 AM 06/28/2020    3:22 PM 02/12/2019   10:48 AM  Fall Risk   Falls in the past year? 0 0 1 1 0   Number falls in past yr: 0 0 1 0   Injury with Fall? 0 0 1 1   Risk for fall due to : No Fall Risks No Fall Risks History of fall(s) History of fall(s)   Follow up Education provided;Falls prevention discussed Falls evaluation completed Falls evaluation completed  Education provided;Falls evaluation completed  Falls evaluation completed      Data saved with a previous flowsheet row definition    MEDICARE RISK AT HOME:  Medicare Risk at Home Any stairs in or around the home?: Yes If so, are there any without handrails?: Yes Home free of loose throw rugs in walkways, pet beds, electrical cords, etc?: Yes Adequate lighting in your home to reduce risk of falls?: Yes Life alert?: Yes (apple watch) Use of a cane, walker or w/c?: No Grab bars in the bathroom?: Yes Shower chair or bench in shower?: No Elevated toilet seat or a handicapped toilet?: Yes  TIMED UP AND GO:  Was the test performed?  No  Cognitive Function: 6CIT completed        06/03/2024   11:51 AM  6CIT Screen  What Year? 0 points  What month? 0 points  What time? 0 points  Count back from 20 0 points  Months in reverse 0 points  Repeat phrase 0 points  Total Score 0 points    Immunizations Immunization History  Administered Date(s) Administered   Influenza Inj Mdck Quad With  Preservative 10/03/2018   Influenza Split 11/21/2011   Influenza,inj,Quad PF,6+ Mos  11/02/2020   Moderna Sars-Covid-2 Vaccination 02/27/2020, 03/26/2020   PNEUMOCOCCAL CONJUGATE-20 06/20/2023   Td 12/05/2007   Tdap 10/24/2007, 04/04/2013    Screening Tests Health Maintenance  Topic Date Due   Zoster Vaccines- Shingrix (1 of 2) Never done   COVID-19 Vaccine (3 - Moderna risk series) 04/23/2020   DTaP/Tdap/Td (4 - Td or Tdap) 04/05/2023   INFLUENZA VACCINE  07/04/2024   Medicare Annual Wellness (AWV)  06/03/2025   Colonoscopy  07/19/2025   Pneumococcal Vaccine: 50+ Years  Completed   Hepatitis C Screening  Completed   Hepatitis B Vaccines  Aged Out   HPV VACCINES  Aged Out   Meningococcal B Vaccine  Aged Out    Health Maintenance  Health Maintenance Due  Topic Date Due   Zoster Vaccines- Shingrix (1 of 2) Never done   COVID-19 Vaccine (3 - Moderna risk series) 04/23/2020   DTaP/Tdap/Td (4 - Td or Tdap) 04/05/2023   Health Maintenance Items Addressed: None needed at this time  Additional Screening:  Vision Screening: Recommended annual ophthalmology exams for early detection of glaucoma and other disorders of the eye. Would you like a referral to an eye doctor? No    Dental Screening: Recommended annual dental exams for proper oral hygiene  Community Resource Referral / Chronic Care Management: CRR required this visit?  No   CCM required this visit?  No   Plan:    I have personally reviewed and noted the following in the patient's chart:   Medical and social history Use of alcohol, tobacco or illicit drugs  Current medications and supplements including opioid prescriptions. Patient is not currently taking opioid prescriptions. Functional ability and status Nutritional status Physical activity Advanced directives List of other physicians Hospitalizations, surgeries, and ER visits in previous 12 months Vitals Screenings to include cognitive, depression, and  falls Referrals and appointments  In addition, I have reviewed and discussed with patient certain preventive protocols, quality metrics, and best practice recommendations. A written personalized care plan for preventive services as well as general preventive health recommendations were provided to patient.   Erminio LITTIE Saris, LPN   01/11/7973   After Visit Summary: (MyChart) Due to this being a telephonic visit, the after visit summary with patients personalized plan was offered to patient via MyChart   Notes: Nothing significant to report at this time.

## 2024-06-03 NOTE — Patient Instructions (Addendum)
 Mr. Shane Gonzalez , Thank you for taking time out of your busy schedule to complete your Annual Wellness Visit with me. I enjoyed our conversation and look forward to speaking with you again next year. I, as well as your care team,  appreciate your ongoing commitment to your health goals. Please review the following plan we discussed and let me know if I can assist you in the future. Your Game plan/ To Do List    Follow up Visits: Next Medicare AWV with our clinical staff: 06/04/25 @ 11:30am televisit   Have you seen your provider in the last 6 months (3 months if uncontrolled diabetes)? No Next Office Visit with your provider: 06/23/24  Clinician Recommendations:  Aim for 30 minutes of exercise or brisk walking, 6-8 glasses of water, and 5 servings of fruits and vegetables each day.       This is a list of the screening recommended for you and due dates:  Health Maintenance  Topic Date Due   Zoster (Shingles) Vaccine (1 of 2) Never done   COVID-19 Vaccine (3 - Moderna risk series) 04/23/2020   DTaP/Tdap/Td vaccine (4 - Td or Tdap) 04/05/2023   Flu Shot  07/04/2024   Medicare Annual Wellness Visit  06/03/2025   Colon Cancer Screening  07/19/2025   Pneumococcal Vaccine for age over 54  Completed   Hepatitis C Screening  Completed   Hepatitis B Vaccine  Aged Out   HPV Vaccine  Aged Out   Meningitis B Vaccine  Aged Out    Advanced directives: (Copy Requested) Please bring a copy of your health care power of attorney and living will to the office to be added to your chart at your convenience. You can mail to [facility] [address] [place], [place] [address] or email to [email or web address] Advance Care Planning is important because it:  [x]  Makes sure you receive the medical care that is consistent with your values, goals, and preferences  [x]  It provides guidance to your family and loved ones and reduces their decisional burden about whether or not they are making the  right decisions based on your wishes.  Follow the link provided in your after visit summary or read over the paperwork we have mailed to you to help you started getting your Advance Directives in place. If you need assistance in completing these, please reach out to us  so that we can help you!

## 2024-06-14 ENCOUNTER — Telehealth: Payer: Self-pay | Admitting: Family Medicine

## 2024-06-14 DIAGNOSIS — I4819 Other persistent atrial fibrillation: Secondary | ICD-10-CM

## 2024-06-14 DIAGNOSIS — Z125 Encounter for screening for malignant neoplasm of prostate: Secondary | ICD-10-CM

## 2024-06-14 DIAGNOSIS — E782 Mixed hyperlipidemia: Secondary | ICD-10-CM

## 2024-06-14 DIAGNOSIS — K760 Fatty (change of) liver, not elsewhere classified: Secondary | ICD-10-CM

## 2024-06-14 DIAGNOSIS — E6609 Other obesity due to excess calories: Secondary | ICD-10-CM

## 2024-06-14 DIAGNOSIS — Z131 Encounter for screening for diabetes mellitus: Secondary | ICD-10-CM

## 2024-06-14 NOTE — Telephone Encounter (Signed)
-----   Message from [name] sent at 05/26/2024  3:09 PM EDT ----- Regarding: Lab orders for Mon, 7.14.25 Patient is scheduled for CPX labs, please order future labs, Thanks , Veva

## 2024-06-16 ENCOUNTER — Other Ambulatory Visit (INDEPENDENT_AMBULATORY_CARE_PROVIDER_SITE_OTHER): Payer: Medicare Other

## 2024-06-16 ENCOUNTER — Ambulatory Visit: Payer: Self-pay | Admitting: Family Medicine

## 2024-06-16 DIAGNOSIS — Z6834 Body mass index (BMI) 34.0-34.9, adult: Secondary | ICD-10-CM | POA: Diagnosis not present

## 2024-06-16 DIAGNOSIS — Z131 Encounter for screening for diabetes mellitus: Secondary | ICD-10-CM | POA: Diagnosis not present

## 2024-06-16 DIAGNOSIS — E6609 Other obesity due to excess calories: Secondary | ICD-10-CM | POA: Diagnosis not present

## 2024-06-16 DIAGNOSIS — I4819 Other persistent atrial fibrillation: Secondary | ICD-10-CM | POA: Diagnosis not present

## 2024-06-16 DIAGNOSIS — E66811 Obesity, class 1: Secondary | ICD-10-CM

## 2024-06-16 DIAGNOSIS — E782 Mixed hyperlipidemia: Secondary | ICD-10-CM

## 2024-06-16 DIAGNOSIS — Z125 Encounter for screening for malignant neoplasm of prostate: Secondary | ICD-10-CM

## 2024-06-16 DIAGNOSIS — K760 Fatty (change of) liver, not elsewhere classified: Secondary | ICD-10-CM

## 2024-06-16 LAB — CBC WITH DIFFERENTIAL/PLATELET
Basophils Absolute: 0 K/uL (ref 0.0–0.1)
Basophils Relative: 0.3 % (ref 0.0–3.0)
Eosinophils Absolute: 0.1 K/uL (ref 0.0–0.7)
Eosinophils Relative: 2.9 % (ref 0.0–5.0)
HCT: 41.7 % (ref 39.0–52.0)
Hemoglobin: 14.2 g/dL (ref 13.0–17.0)
Lymphocytes Relative: 26.1 % (ref 12.0–46.0)
Lymphs Abs: 1.2 K/uL (ref 0.7–4.0)
MCHC: 34.2 g/dL (ref 30.0–36.0)
MCV: 95.5 fl (ref 78.0–100.0)
Monocytes Absolute: 0.3 K/uL (ref 0.1–1.0)
Monocytes Relative: 6.8 % (ref 3.0–12.0)
Neutro Abs: 2.8 K/uL (ref 1.4–7.7)
Neutrophils Relative %: 63.9 % (ref 43.0–77.0)
Platelets: 184 K/uL (ref 150.0–400.0)
RBC: 4.36 Mil/uL (ref 4.22–5.81)
RDW: 13.3 % (ref 11.5–15.5)
WBC: 4.5 K/uL (ref 4.0–10.5)

## 2024-06-16 LAB — LIPID PANEL
Cholesterol: 167 mg/dL (ref 0–200)
HDL: 61.3 mg/dL (ref >39.00)
LDL Cholesterol: 91 mg/dL (ref 0–99)
NonHDL: 105.48
Total CHOL/HDL Ratio: 3
Triglycerides: 70 mg/dL (ref 0.0–149.0)
VLDL: 14 mg/dL (ref 0.0–40.0)

## 2024-06-16 LAB — COMPREHENSIVE METABOLIC PANEL WITH GFR
ALT: 30 U/L (ref 0–53)
AST: 25 U/L (ref 0–37)
Albumin: 4.7 g/dL (ref 3.5–5.2)
Alkaline Phosphatase: 76 U/L (ref 39–117)
BUN: 18 mg/dL (ref 6–23)
CO2: 28 meq/L (ref 19–32)
Calcium: 9.7 mg/dL (ref 8.4–10.5)
Chloride: 106 meq/L (ref 96–112)
Creatinine, Ser: 0.9 mg/dL (ref 0.40–1.50)
GFR: 89.11 mL/min (ref >60.00)
Glucose, Bld: 93 mg/dL (ref 70–99)
Potassium: 4.8 meq/L (ref 3.5–5.1)
Sodium: 141 meq/L (ref 135–145)
Total Bilirubin: 0.8 mg/dL (ref 0.2–1.2)
Total Protein: 7 g/dL (ref 6.0–8.3)

## 2024-06-16 LAB — TSH: TSH: 2.6 u[IU]/mL (ref 0.35–5.50)

## 2024-06-16 LAB — PSA, MEDICARE: PSA: 0.38 ng/mL (ref 0.10–4.00)

## 2024-06-16 LAB — HEMOGLOBIN A1C: Hgb A1c MFr Bld: 5.8 % (ref 4.6–6.5)

## 2024-06-23 ENCOUNTER — Encounter: Payer: Medicare Other | Admitting: Family Medicine

## 2024-06-27 ENCOUNTER — Encounter: Payer: Self-pay | Admitting: Family Medicine

## 2024-06-27 ENCOUNTER — Ambulatory Visit: Admitting: Family Medicine

## 2024-06-27 VITALS — BP 118/82 | HR 84 | Temp 98.3°F | Ht 70.04 in | Wt 249.4 lb

## 2024-06-27 DIAGNOSIS — G4733 Obstructive sleep apnea (adult) (pediatric): Secondary | ICD-10-CM | POA: Diagnosis not present

## 2024-06-27 DIAGNOSIS — E782 Mixed hyperlipidemia: Secondary | ICD-10-CM | POA: Diagnosis not present

## 2024-06-27 DIAGNOSIS — K760 Fatty (change of) liver, not elsewhere classified: Secondary | ICD-10-CM | POA: Diagnosis not present

## 2024-06-27 DIAGNOSIS — E66812 Obesity, class 2: Secondary | ICD-10-CM

## 2024-06-27 DIAGNOSIS — Z0000 Encounter for general adult medical examination without abnormal findings: Secondary | ICD-10-CM

## 2024-06-27 DIAGNOSIS — I480 Paroxysmal atrial fibrillation: Secondary | ICD-10-CM | POA: Diagnosis not present

## 2024-06-27 DIAGNOSIS — Z125 Encounter for screening for malignant neoplasm of prostate: Secondary | ICD-10-CM

## 2024-06-27 DIAGNOSIS — Z8042 Family history of malignant neoplasm of prostate: Secondary | ICD-10-CM

## 2024-06-27 DIAGNOSIS — R7303 Prediabetes: Secondary | ICD-10-CM

## 2024-06-27 DIAGNOSIS — Z6835 Body mass index (BMI) 35.0-35.9, adult: Secondary | ICD-10-CM

## 2024-06-27 NOTE — Assessment & Plan Note (Addendum)
 Lab Results  Component Value Date   HGBA1C 5.8 06/16/2024   HGBA1C 5.5 06/11/2023   HGBA1C 5.3 06/29/2020   disc imp of low glycemic diet and wt loss to prevent DM2

## 2024-06-27 NOTE — Patient Instructions (Addendum)
 You are due for a tetanus shot  Get it at your pharmacy    Try to get most of your carbohydrates from produce (with the exception of white potatoes) and whole grains Eat less bread/pasta/rice/snack foods/cereals/sweets and other items from the middle of the grocery store (processed carbs)  Get lean protein   The following are examples of protein in diet  Meat  Fish  Eggs  Dairy products  Soy products  Oat milk  Almond milk Legumes  Nuts and nut butters  Dried beans   Stay active  Add some strength training to your routine, this is important for bone and brain health and can reduce your risk of falls and help your body use insulin properly and regulate weight  Light weights, exercise bands , and internet videos are a good way to start  Yoga (chair or regular), machines , floor exercises or a gym with machines are also good options   Consider starting 1000 international units of vitamin D3 over the counter for bone health

## 2024-06-27 NOTE — Assessment & Plan Note (Signed)
 No further episodes Is off anticoagulation now  Monitors with his apple watch

## 2024-06-27 NOTE — Assessment & Plan Note (Signed)
 Lab Results  Component Value Date   ALT 30 06/16/2024   AST 25 06/16/2024   ALKPHOS 76 06/16/2024   BILITOT 0.8 06/16/2024   Working on weight loss

## 2024-06-27 NOTE — Assessment & Plan Note (Signed)
 Discussed how this problem influences overall health and the risks it imposes  Reviewed plan for weight loss with lower calorie diet (via better food choices (lower glycemic and portion control) along with exercise building up to or more than 30 minutes 5 days per week including some aerobic activity and strength training    May be candidate for GLP-1 in future for weight loss in setting of OSA and fatty liver  He will try on is own first and update us 

## 2024-06-27 NOTE — Progress Notes (Signed)
 Subjective:    Patient ID: Shane Gonzalez, male    DOB: [birth year], 68 y.o.   MRN: [id number]  HPI  Here for health maintenance exam and to review chronic medical problems   Wt Readings from Last 3 Encounters:  06/27/24 249 lb 6.4 oz (113.1 kg)  06/03/24 249 lb (112.9 kg)  04/15/24 249 lb (112.9 kg)   35.75 kg/m  Vitals:   06/27/24 1543  BP: 118/82  Pulse: 84  Temp: 98.3 F (36.8 C)  SpO2: 97%    Immunization History  Administered Date(s) Administered   Influenza Inj Mdck Quad With Preservative 10/03/2018   Influenza Split 11/21/2011   Influenza,inj,Quad PF,6+ Mos 11/02/2020   Moderna Sars-Covid-2 Vaccination 02/27/2020, 03/26/2020   PNEUMOCOCCAL CONJUGATE-20 06/20/2023   Td 12/05/2007   Tdap 10/24/2007, 04/04/2013    Health Maintenance Due  Topic Date Due   COVID-19 Vaccine (3 - Moderna risk series) 04/23/2020   DTaP/Tdap/Td (4 - Td or Tdap) 04/05/2023   Shingrix vaccine - declines   Due for tetanus vaccine -will get at pharmacy   Gets flu shot in the fall    Prostate health Lab Results  Component Value Date   PSA 0.38 06/16/2024   PSA 0.61 06/11/2023   PSA 0.34 11/15/2021   Family history of prostate cancer (brother in 30s)  No change in urinary habits  Nocturia 2-3 times / tolerated years   Viagra  for ED  Colon cancer screening -colonoscopy 07/2015   Bone health   Falls-none  Fractures-none  Supplements -none   Exercise  Looking to retire in [place] - this would free up more time  Looking at a chair program     Osa  Cpap  Hose is broken- getting another one      Mood    06/27/2024    3:45 PM 06/03/2024   11:42 AM 06/20/2023    9:27 AM 11/15/2021   10:14 AM 11/02/2020    8:27 AM  Depression screen PHQ 2/9  Decreased Interest 0 0 0 0 0  Down, Depressed, Hopeless 0 0 0 0 0  PHQ - 2 Score 0 0 0 0 0  Altered sleeping 0  0 0 0  Tired, decreased energy 0  0 0 0  Change in appetite 0  0 0 0  Feeling bad or failure about yourself  0  0 0  0  Trouble concentrating 0  0 0 0  Moving slowly or fidgety/restless 0  0 0 0  Suicidal thoughts 0  0 0 0  PHQ-9 Score 0  0 0 0  Difficult doing work/chores Not difficult at all  Not difficult at all Not difficult at all Not difficult at all   A fib  No a fib burden since ablation  Pt choose to stay off anti coagulation  Monitors with apple watch   BP Readings from Last 3 Encounters:  06/27/24 118/82  04/15/24 122/70  03/26/24 114/76   Pulse Readings from Last 3 Encounters:  06/27/24 84  04/15/24 90  03/26/24 71    Lab Results  Component Value Date   NA 141 06/16/2024   K 4.8 06/16/2024   CO2 28 06/16/2024   GLUCOSE 93 06/16/2024   BUN 18 06/16/2024   CREATININE 0.90 06/16/2024   CALCIUM  9.7 06/16/2024   GFR 89.11 06/16/2024   EGFR 95 12/18/2023   GFRNONAA >60 01/17/2024   Hyperlipidemia Lab Results  Component Value Date   CHOL 167 06/16/2024   CHOL 199 06/11/2023  CHOL 204 (H) 11/15/2021   Lab Results  Component Value Date   HDL 61.30 06/16/2024   HDL 65.00 06/11/2023   HDL 67.30 11/15/2021   Lab Results  Component Value Date   LDLCALC 91 06/16/2024   LDLCALC 117 (H) 06/11/2023   LDLCALC 122 (H) 11/15/2021   Lab Results  Component Value Date   TRIG 70.0 06/16/2024   TRIG 88.0 06/11/2023   TRIG 70.0 11/15/2021   Lab Results  Component Value Date   CHOLHDL 3 06/16/2024   CHOLHDL 3 06/11/2023   CHOLHDL 3 11/15/2021   Lab Results  Component Value Date   LDLDIRECT 143.0 07/09/2013   LDLDIRECT 172.9 03/26/2013   LDLDIRECT 133.3 11/21/2011  Atorvastain 40   DM screen Lab Results  Component Value Date   HGBA1C 5.8 06/16/2024   HGBA1C 5.5 06/11/2023   HGBA1C 5.3 06/29/2020   Staying away from bread  Cutting back french fried  No soda (used to drink a lot)  More veggies  Cutting portions also    Lab Results  Component Value Date   TSH 2.60 06/16/2024   Lab Results  Component Value Date   ALT 30 06/16/2024   AST 25 06/16/2024    ALKPHOS 76 06/16/2024   BILITOT 0.8 06/16/2024   Lab Results  Component Value Date   WBC 4.5 06/16/2024   HGB 14.2 06/16/2024   HCT 41.7 06/16/2024   MCV 95.5 06/16/2024   PLT 184.0 06/16/2024           Patient Active Problem List   Diagnosis Date Noted   OSA (obstructive sleep apnea) 06/27/2024   ED (erectile dysfunction) 06/20/2023   Prediabetes 06/01/2023   Obesity 06/28/2020   Fatty liver 12/22/2019   PAF (paroxysmal atrial fibrillation) (HCC) 12/15/2019   History of COVID-19 12/12/2019   Family history of prostate cancer 04/04/2013   Routine general medical examination at a health care facility 11/13/2011   Prostate cancer screening 11/13/2011   Hyperlipidemia 08/17/2008   DIVERTICULITIS, HX OF 08/04/2008   Past Medical History:  Diagnosis Date   Aortic valve sclerosis    a. 12/2019 Echo: EF of 60 to 65% with moderate LVH, no wall motion abnormalities, normal RV size and function, trivial MR, and mild to moderate aortic sclerosis without stenosis   Bilateral bunions    both feet   Blood in stool    Hx of   Diverticulosis    Hx of   Erectile dysfunction    Fatty liver 12/13/2019   GERD (gastroesophageal reflux disease)    Hyperlipidemia    PAF (paroxysmal atrial fibrillation) (HCC)    a. 12/2019 Dx in setting of COVID-->placed on Xarelto -->converted on his own prior to DCCV and Xarelto  d/c'd; b. 10/2023 Incidentally noted to be back in Afib-->Xarelto  (CHA2DS2VASc = 1).   Toe deformity    2nd   Toenail fungus    rt big toe   Past Surgical History:  Procedure Laterality Date   ANTERIOR CRUCIATE LIGAMENT REPAIR     ARTHROSCOPIC REPAIR ACL     ARTHROSCOPIC REPAIR PCL     ATRIAL FIBRILLATION ABLATION N/A 01/17/2024   Procedure: ATRIAL FIBRILLATION ABLATION;  Surgeon: Kennyth Chew, MD;  Location: [facility] INVASIVE CV LAB;  Service: Cardiovascular;  Laterality: N/A;   BUNIONECTOMY  2011   BUNIONECTOMY  2011   CARDIOVERSION N/A 12/21/2023   Procedure:  CARDIOVERSION;  Surgeon: Okey Vina GAILS, MD;  Location: [facility] INVASIVE CV LAB;  Service: Cardiovascular;  Laterality: N/A;   CLOSED  REDUCTION SHOULDER DISLOCATION     left reduced under anesthesia   hammer toe repair  2011   REFRACTIVE SURGERY  2009   right shoulder      SHOULDER SURGERY     right   TENDON REPAIR  2011   VASECTOMY     VASECTOMY     WISDOM TOOTH EXTRACTION  2011   Social History   Tobacco Use   Smoking status: Former    Types: Cigars   Smokeless tobacco: Never   Tobacco comments:    only smokes once yearly  Vaping Use   Vaping status: Never Used  Substance Use Topics   Alcohol use: Yes    Alcohol/week: 8.0 standard drinks of alcohol    Types: 8 Cans of beer per week    Comment: 8-10 beers on the weekends   Drug use: No   Family History  Problem Relation Age of Onset   Cancer Mother        ? pancreatic   Stroke Mother    Vision loss Mother    Cancer Father        lung ca   Hypertension Father    Vision loss Father    Diverticulitis Sister    Prostate cancer Brother    Alcohol abuse Brother    Cancer Brother    Diverticulitis Brother        x2 brothers   Heart disease Maternal Grandfather    Alcohol abuse Brother    Diverticulitis Brother    Cancer Paternal Aunt    Cancer Paternal Uncle    No Known Allergies Current Outpatient Medications on File Prior to Visit  Medication Sig Dispense Refill   atorvastatin  (LIPITOR) 40 MG tablet Take 1 tablet (40 mg total) by mouth daily. 90 tablet 3   sildenafil  (VIAGRA ) 50 MG tablet Take 1 tablet (50 mg total) by mouth daily as needed for erectile dysfunction. 10 tablet 0   No current facility-administered medications on file prior to visit.    Review of Systems  Constitutional:  Negative for activity change, appetite change, fatigue, fever and unexpected weight change.  HENT:  Negative for congestion, rhinorrhea, sore throat and trouble swallowing.   Eyes:  Negative for pain, redness, itching and visual  disturbance.  Respiratory:  Negative for cough, chest tightness, shortness of breath and wheezing.   Cardiovascular:  Negative for chest pain and palpitations.  Gastrointestinal:  Negative for abdominal pain, blood in stool, constipation, diarrhea and nausea.  Endocrine: Negative for cold intolerance, heat intolerance, polydipsia and polyuria.  Genitourinary:  Negative for difficulty urinating, dysuria, frequency and urgency.  Musculoskeletal:  Positive for arthralgias. Negative for joint swelling and myalgias.  Skin:  Negative for pallor and rash.  Neurological:  Negative for dizziness, tremors, weakness, numbness and headaches.  Hematological:  Negative for adenopathy. Does not bruise/bleed easily.  Psychiatric/Behavioral:  Negative for decreased concentration and dysphoric mood. The patient is not nervous/anxious.        Objective:   Physical Exam Constitutional:      General: He is not in acute distress.    Appearance: Normal appearance. He is well-developed. He is obese. He is not ill-appearing or diaphoretic.  HENT:     Head: Normocephalic and atraumatic.     Right Ear: Tympanic membrane, ear canal and external ear normal.     Left Ear: Tympanic membrane, ear canal and external ear normal.     Nose: Nose normal. No congestion.     Mouth/Throat:  Mouth: Mucous membranes are moist.     Pharynx: Oropharynx is clear. No posterior oropharyngeal erythema.  Eyes:     General: No scleral icterus.       Right eye: No discharge.        Left eye: No discharge.     Conjunctiva/sclera: Conjunctivae normal.     Pupils: Pupils are equal, round, and reactive to light.  Neck:     Thyroid : No thyromegaly.     Vascular: No carotid bruit or JVD.  Cardiovascular:     Rate and Rhythm: Normal rate and regular rhythm.     Pulses: Normal pulses.     Heart sounds: Normal heart sounds.     No gallop.  Pulmonary:     Effort: Pulmonary effort is normal. No respiratory distress.     Breath  sounds: Normal breath sounds. No wheezing or rales.     Comments: Good air exch Chest:     Chest wall: No tenderness.  Abdominal:     General: Bowel sounds are normal. There is no distension or abdominal bruit.     Palpations: Abdomen is soft. There is no mass.     Tenderness: There is no abdominal tenderness.     Hernia: No hernia is present.  Musculoskeletal:        General: No tenderness.     Cervical back: Normal range of motion and neck supple. No rigidity. No muscular tenderness.     Right lower leg: No edema.     Left lower leg: No edema.  Lymphadenopathy:     Cervical: No cervical adenopathy.  Skin:    General: Skin is warm and dry.     Coloration: Skin is not pale.     Findings: No erythema or rash.     Comments: Solar lentigines diffusely   Neurological:     Mental Status: He is alert.     Cranial Nerves: No cranial nerve deficit.     Motor: No abnormal muscle tone.     Coordination: Coordination normal.     Gait: Gait normal.     Deep Tendon Reflexes: Reflexes are normal and symmetric. Reflexes normal.  Psychiatric:        Mood and Affect: Mood normal.        Cognition and Memory: Cognition and memory normal.           Assessment & Plan:   Problem List Items Addressed This Visit       Cardiovascular and Mediastinum   PAF (paroxysmal atrial fibrillation) (HCC)   No further episodes Is off anticoagulation now  Monitors with his apple watch        Respiratory   OSA (obstructive sleep apnea)   Continues cpap        Digestive   Fatty liver   Lab Results  Component Value Date   ALT 30 06/16/2024   AST 25 06/16/2024   ALKPHOS 76 06/16/2024   BILITOT 0.8 06/16/2024   Working on weight loss         Other   Routine general medical examination at a health care facility - Primary   Reviewed health habits including diet and exercise and skin cancer prevention Reviewed appropriate screening tests for age  Also reviewed health mt list, fam hx and  immunization status , as well as social and family history   See HPI Labs reviewed and ordered Health Maintenance  Topic Date Due   Zoster (Shingles) Vaccine (1 of 2) Never done  COVID-19 Vaccine (3 - Moderna risk series) 04/23/2020   DTaP/Tdap/Td vaccine (4 - Td or Tdap) 04/05/2023   Flu Shot  07/04/2024   Medicare Annual Wellness Visit  06/27/2025   Colon Cancer Screening  07/19/2025   Pneumococcal Vaccine for age over 62  Completed   Hepatitis C Screening  Completed   Hepatitis B Vaccine  Aged Out   HPV Vaccine  Aged Out   Meningitis B Vaccine  Aged Out    Psa is normal  Pt plans to get Td at pharmacy  Declines shingrix  Discussed fall prevention, supplements and exercise for bone density  Discussed vitamin D3 for bone health  PHQ 0 Discussed strategies for weight loss       Prostate cancer screening   Lab Results  Component Value Date   PSA 0.38 06/16/2024   PSA 0.61 06/11/2023   PSA 0.34 11/15/2021    Family history -brother  No change in urinary habits  Baseline nocturia       Prediabetes   Lab Results  Component Value Date   HGBA1C 5.8 06/16/2024   HGBA1C 5.5 06/11/2023   HGBA1C 5.3 06/29/2020   disc imp of low glycemic diet and wt loss to prevent DM2       Obesity   Discussed how this problem influences overall health and the risks it imposes  Reviewed plan for weight loss with lower calorie diet (via better food choices (lower glycemic and portion control) along with exercise building up to or more than 30 minutes 5 days per week including some aerobic activity and strength training    May be candidate for GLP-1 in future for weight loss in setting of OSA and fatty liver  He will try on is own first and update us        Hyperlipidemia   Disc goals for lipids and reasons to control them Rev last labs with pt Rev low sat fat diet in detail Improved with LDL of 91   Continues atorvastatin  40 mg daily      Family history of prostate cancer    Lab Results  Component Value Date   PSA 0.38 06/16/2024   PSA 0.61 06/11/2023   PSA 0.34 11/15/2021    Will continue to monitor

## 2024-06-27 NOTE — Assessment & Plan Note (Signed)
 Lab Results  Component Value Date   PSA 0.38 06/16/2024   PSA 0.61 06/11/2023   PSA 0.34 11/15/2021    Will continue to monitor

## 2024-06-27 NOTE — Assessment & Plan Note (Signed)
Continues cpap.  

## 2024-06-27 NOTE — Assessment & Plan Note (Addendum)
 Reviewed health habits including diet and exercise and skin cancer prevention Reviewed appropriate screening tests for age  Also reviewed health mt list, fam hx and immunization status , as well as social and family history   See HPI Labs reviewed and ordered Health Maintenance  Topic Date Due   Zoster (Shingles) Vaccine (1 of 2) Never done   COVID-19 Vaccine (3 - Moderna risk series) 04/23/2020   DTaP/Tdap/Td vaccine (4 - Td or Tdap) 04/05/2023   Flu Shot  07/04/2024   Medicare Annual Wellness Visit  06/27/2025   Colon Cancer Screening  07/19/2025   Pneumococcal Vaccine for age over 30  Completed   Hepatitis C Screening  Completed   Hepatitis B Vaccine  Aged Out   HPV Vaccine  Aged Out   Meningitis B Vaccine  Aged Out    Psa is normal  Pt plans to get Td at pharmacy  Declines shingrix  Discussed fall prevention, supplements and exercise for bone density  Discussed vitamin D3 for bone health  PHQ 0 Discussed strategies for weight loss

## 2024-06-27 NOTE — Assessment & Plan Note (Signed)
 Disc goals for lipids and reasons to control them Rev last labs with pt Rev low sat fat diet in detail Improved with LDL of 91   Continues atorvastatin  40 mg daily

## 2024-06-27 NOTE — Assessment & Plan Note (Signed)
 Lab Results  Component Value Date   PSA 0.38 06/16/2024   PSA 0.61 06/11/2023   PSA 0.34 11/15/2021    Family history -brother  No change in urinary habits  Baseline nocturia

## 2024-06-30 DIAGNOSIS — D225 Melanocytic nevi of trunk: Secondary | ICD-10-CM | POA: Diagnosis not present

## 2024-06-30 DIAGNOSIS — S20362A Insect bite (nonvenomous) of left front wall of thorax, initial encounter: Secondary | ICD-10-CM | POA: Diagnosis not present

## 2024-06-30 DIAGNOSIS — Z85820 Personal history of malignant melanoma of skin: Secondary | ICD-10-CM | POA: Diagnosis not present

## 2024-06-30 DIAGNOSIS — D224 Melanocytic nevi of scalp and neck: Secondary | ICD-10-CM | POA: Diagnosis not present

## 2024-06-30 DIAGNOSIS — L814 Other melanin hyperpigmentation: Secondary | ICD-10-CM | POA: Diagnosis not present

## 2024-06-30 DIAGNOSIS — D1801 Hemangioma of skin and subcutaneous tissue: Secondary | ICD-10-CM | POA: Diagnosis not present

## 2024-06-30 DIAGNOSIS — D485 Neoplasm of uncertain behavior of skin: Secondary | ICD-10-CM | POA: Diagnosis not present

## 2024-07-10 DIAGNOSIS — D485 Neoplasm of uncertain behavior of skin: Secondary | ICD-10-CM | POA: Diagnosis not present

## 2024-07-10 DIAGNOSIS — L988 Other specified disorders of the skin and subcutaneous tissue: Secondary | ICD-10-CM | POA: Diagnosis not present

## 2024-07-16 NOTE — Progress Notes (Deleted)
  Electrophysiology Office Note:   Date:  07/16/2024  ID:  Shane Gonzalez, DOB 1956-05-09, MRN [id number]  Primary Cardiologist: Darryle ONEIDA Decent, MD Primary Heart Failure: None Electrophysiologist: Fonda Kitty, MD  {Click to update primary MD,subspecialty MD or APP then REFRESH:1}    History of Present Illness:   Shane Gonzalez is a 68 y.o. male with h/o AF, HLD, GERD  seen today for routine electrophysiology followup.   Since last being seen in our clinic the patient reports doing ***.    He denies chest pain, palpitations, dyspnea, PND, orthopnea, nausea, vomiting, dizziness, syncope, edema, weight gain, or early satiety.   Review of systems complete and found to be negative unless listed in HPI.   EP Information / Studies Reviewed:    EKG is ordered today. Personal review as below.      Studies:  ECHO 11/2023 > LVEF 60-65%, mild MV regurgitation  CT Cardiac Morphology 12/25/23 > normal PV drainage into the LA, CAC score of 153 / 63rd percentile for matched controls  EPS 01/17/24 > successful PVI, ablation/isolation of posterior wall   Arrhythmia / AAD AF > initial dx 2021 after COVID DCCV 12/21/23  Amiodarone  > initiated 12/2023 > stopped 02/2024 post ablation  Risk Assessment/Calculations:    CHA2DS2-VASc Score = 1  {Confirm score is correct.  If not, click here to update score.  REFRESH note.  :1} This indicates a 0.6% annual risk of stroke. The patient's score is based upon: CHF History: 0 HTN History: 0 Diabetes History: 0 Stroke History: 0 Vascular Disease History: 0 Age Score: 1 Gender Score: 0     No BP recorded.  {Refresh Note OR Click here to enter BP  :1}***   STOP-Bang Score:  5  { Consider Dx Sleep Disordered Breathing or Sleep Apnea  ICD G47.33          :1}     Physical Exam:   VS:  There were no vitals taken for this visit.   Wt Readings from Last 3 Encounters:  06/27/24 249 lb 6.4 oz (113.1 kg)  06/03/24 249 lb (112.9 kg)  04/15/24 249 lb (112.9 kg)      GEN: Well nourished, well developed in no acute distress NECK: No JVD; No carotid bruits CARDIAC: {[id number]}, no murmurs, rubs, gallops RESPIRATORY:  Clear to auscultation without rales, wheezing or rhonchi  ABDOMEN: Soft, non-tender, non-distended EXTREMITIES:  No edema; No deformity   ASSESSMENT AND PLAN:    Persistent Atrial Fibrillation  CHA2DS2-VASc 1, s/p ablation 01/17/24 -pt previously elected to stop OAC -no AF burden post ablation ***  -monitors with an Apple Watch ***   Moderate OSA  Home sleep study 12/2023 with moderate OSA  -CPAP ?? ***  Follow up with Dr. Kitty {EPFOLLOW [id number]}  Signed, Daphne Barrack, NP-C, AGACNP-BC [facility] - Electrophysiology  07/16/2024, 8:46 PM

## 2024-07-22 ENCOUNTER — Ambulatory Visit: Attending: Pulmonary Disease | Admitting: Pulmonary Disease

## 2024-07-22 DIAGNOSIS — G4730 Sleep apnea, unspecified: Secondary | ICD-10-CM

## 2024-07-22 DIAGNOSIS — I4819 Other persistent atrial fibrillation: Secondary | ICD-10-CM

## 2024-08-19 ENCOUNTER — Ambulatory Visit (INDEPENDENT_AMBULATORY_CARE_PROVIDER_SITE_OTHER): Admitting: Family Medicine

## 2024-08-19 ENCOUNTER — Ambulatory Visit: Payer: Self-pay | Admitting: Family Medicine

## 2024-08-19 ENCOUNTER — Ambulatory Visit
Admission: RE | Admit: 2024-08-19 | Discharge: 2024-08-19 | Disposition: A | Discharge status: Home / Self Care | Source: Ambulatory Visit | Attending: Family Medicine | Admitting: Family Medicine

## 2024-08-19 ENCOUNTER — Encounter: Payer: Self-pay | Admitting: Family Medicine

## 2024-08-19 VITALS — BP 116/80 | HR 54 | Temp 98.2°F | Ht 70.0 in | Wt 247.2 lb

## 2024-08-19 DIAGNOSIS — R1032 Left lower quadrant pain: Secondary | ICD-10-CM | POA: Diagnosis not present

## 2024-08-19 DIAGNOSIS — K5730 Diverticulosis of large intestine without perforation or abscess without bleeding: Secondary | ICD-10-CM | POA: Diagnosis not present

## 2024-08-19 LAB — CBC WITH DIFFERENTIAL/PLATELET
Basophils Absolute: 0 K/uL (ref 0.0–0.1)
Basophils Relative: 0.2 % (ref 0.0–3.0)
Eosinophils Absolute: 0.1 K/uL (ref 0.0–0.7)
Eosinophils Relative: 2.1 % (ref 0.0–5.0)
HCT: 41.4 % (ref 39.0–52.0)
Hemoglobin: 14 g/dL (ref 13.0–17.0)
Lymphocytes Relative: 26.8 % (ref 12.0–46.0)
Lymphs Abs: 1 K/uL (ref 0.7–4.0)
MCHC: 33.9 g/dL (ref 30.0–36.0)
MCV: 96 fl (ref 78.0–100.0)
Monocytes Absolute: 0.3 K/uL (ref 0.1–1.0)
Monocytes Relative: 7.9 % (ref 3.0–12.0)
Neutro Abs: 2.4 K/uL (ref 1.4–7.7)
Neutrophils Relative %: 63 % (ref 43.0–77.0)
Platelets: 207 K/uL (ref 150.0–400.0)
RBC: 4.32 Mil/uL (ref 4.22–5.81)
RDW: 12.8 % (ref 11.5–15.5)
WBC: 3.8 K/uL — ABNORMAL LOW (ref 4.0–10.5)

## 2024-08-19 LAB — LIPASE: Lipase: 24 U/L (ref 11.0–59.0)

## 2024-08-19 MED ORDER — IOHEXOL 300 MG/ML  SOLN
100.0000 mL | Freq: Once | INTRAMUSCULAR | Status: AC | PRN
  Administered 2024-08-19: 100 mL via INTRAVENOUS

## 2024-08-19 NOTE — Progress Notes (Signed)
 Patient ID: Shane Gonzalez, male    DOB: Sep 03, 1956, 68 y.o.   MRN: [id number]  This visit was conducted in person.  BP 116/80   Pulse (!) 54   Temp 98.2 F (36.8 C) (Temporal)   Ht 5' 10 (1.778 m)   Wt 247 lb 4 oz (112.2 kg)   SpO2 96%   BMI 35.48 kg/m    CC:  Chief Complaint  Patient presents with   LLQ Pain    Along the belt line x 2 to 3 weeks    Subjective:   HPI: Shane Gonzalez is a 68 y.o. male patient of Dr. Graham with history of OSA paroxysmal atrial fibrillation, fatty liver and prediabetes presenting on 08/19/2024 for LLQ Pain (Along the belt line x 2 to 3 weeks)   New onset left lower quadrant pain ongoing for the last  3 weeks.  More constipation than usual, BM straining, firm BM  daily to every other day.  No change in diet except some spicy food after  Feels like pressure. Worse with getting up from ground.  Does physical activity.. picking up motor.  No testicular enlargement, no bulging.  No nausea.  No emesis.  No fever.  No blood in stool.  No urinary symptoms.    No history of hernia.    Last colonoscopy July 20, 2015: Showed moderate diverticulosis  Relevant past medical, surgical, family and social history reviewed and updated as indicated. Interim medical history since our last visit reviewed. Allergies and medications reviewed and updated. Outpatient Medications Prior to Visit  Medication Sig Dispense Refill   sildenafil  (VIAGRA ) 50 MG tablet Take 1 tablet (50 mg total) by mouth daily as needed for erectile dysfunction. 10 tablet 0   atorvastatin  (LIPITOR) 40 MG tablet Take 1 tablet (40 mg total) by mouth daily. (Patient not taking: Reported on 08/19/2024) 90 tablet 3   No facility-administered medications prior to visit.     Per HPI unless specifically indicated in ROS section below Review of Systems  Constitutional:  Negative for fatigue and fever.  HENT:  Negative for ear pain.   Eyes:  Negative for pain.  Respiratory:  Negative for  cough and shortness of breath.   Cardiovascular:  Negative for chest pain, palpitations and leg swelling.  Gastrointestinal:  Positive for abdominal pain.  Genitourinary:  Negative for dysuria.  Musculoskeletal:  Negative for arthralgias.  Neurological:  Negative for syncope, light-headedness and headaches.  Psychiatric/Behavioral:  Negative for dysphoric mood.    Objective:  BP 116/80   Pulse (!) 54   Temp 98.2 F (36.8 C) (Temporal)   Ht 5' 10 (1.778 m)   Wt 247 lb 4 oz (112.2 kg)   SpO2 96%   BMI 35.48 kg/m   Wt Readings from Last 3 Encounters:  08/19/24 247 lb 4 oz (112.2 kg)  06/27/24 249 lb 6.4 oz (113.1 kg)  06/03/24 249 lb (112.9 kg)      Physical Exam Exam conducted with a chaperone present.  Constitutional:      Appearance: He is well-developed.  HENT:     Head: Normocephalic.     Right Ear: Hearing normal.     Left Ear: Hearing normal.     Nose: Nose normal.  Neck:     Thyroid : No thyroid  mass or thyromegaly.     Vascular: No carotid bruit.     Trachea: Trachea normal.  Cardiovascular:     Rate and Rhythm: Normal rate and regular rhythm.  Pulses: Normal pulses.     Heart sounds: Heart sounds not distant. No murmur heard.    No friction rub. No gallop.     Comments: No peripheral edema Pulmonary:     Effort: Pulmonary effort is normal. No respiratory distress.     Breath sounds: Normal breath sounds.  Abdominal:     Tenderness: There is abdominal tenderness in the periumbilical area and left lower quadrant. There is no right CVA tenderness, left CVA tenderness, guarding or rebound. Negative signs include Murphy's sign.     Hernia: No hernia is present. There is no hernia in the umbilical area, ventral area, left inguinal area, right femoral area or right inguinal area.     Comments:  No definite hernia palpated, but did feel quick impulse on femoral hernia eval on left, not reproducible though  Genitourinary:    Testes: Normal.     Epididymis:      Right: Normal.     Left: Normal.  Lymphadenopathy:     Lower Body: No right inguinal adenopathy. No left inguinal adenopathy.  Skin:    General: Skin is warm and dry.     Findings: No rash.  Psychiatric:        Speech: Speech normal.        Behavior: Behavior normal.        Thought Content: Thought content normal.       Results for orders placed or performed in visit on 06/16/24  PSA, Medicare   Collection Time: 06/16/24  8:02 AM  Result Value Ref Range   PSA 0.38 0.10 - 4.00 ng/ml  Hemoglobin A1c   Collection Time: 06/16/24  8:02 AM  Result Value Ref Range   Hgb A1c MFr Bld 5.8 4.6 - 6.5 %  Comprehensive metabolic panel with GFR   Collection Time: 06/16/24  8:02 AM  Result Value Ref Range   Sodium 141 135 - 145 mEq/L   Potassium 4.8 3.5 - 5.1 mEq/L   Chloride 106 96 - 112 mEq/L   CO2 28 19 - 32 mEq/L   Glucose, Bld 93 70 - 99 mg/dL   BUN 18 6 - 23 mg/dL   Creatinine, Ser 9.09 0.40 - 1.50 mg/dL   Total Bilirubin 0.8 0.2 - 1.2 mg/dL   Alkaline Phosphatase 76 39 - 117 U/L   AST 25 0 - 37 U/L   ALT 30 0 - 53 U/L   Total Protein 7.0 6.0 - 8.3 g/dL   Albumin 4.7 3.5 - 5.2 g/dL   GFR 10.88 >39.99 mL/min   Calcium  9.7 8.4 - 10.5 mg/dL  CBC with Differential/Platelet   Collection Time: 06/16/24  8:02 AM  Result Value Ref Range   WBC 4.5 4.0 - 10.5 K/uL   RBC 4.36 4.22 - 5.81 Mil/uL   Hemoglobin 14.2 13.0 - 17.0 g/dL   HCT 58.2 60.9 - 47.9 %   MCV 95.5 78.0 - 100.0 fl   MCHC 34.2 30.0 - 36.0 g/dL   RDW 86.6 88.4 - 84.4 %   Platelets 184.0 150.0 - 400.0 K/uL   Neutrophils Relative % 63.9 43.0 - 77.0 %   Lymphocytes Relative 26.1 12.0 - 46.0 %   Monocytes Relative 6.8 3.0 - 12.0 %   Eosinophils Relative 2.9 0.0 - 5.0 %   Basophils Relative 0.3 0.0 - 3.0 %   Neutro Abs 2.8 1.4 - 7.7 K/uL   Lymphs Abs 1.2 0.7 - 4.0 K/uL   Monocytes Absolute 0.3 0.1 - 1.0 K/uL  Eosinophils Absolute 0.1 0.0 - 0.7 K/uL   Basophils Absolute 0.0 0.0 - 0.1 K/uL  Lipid panel   Collection  Time: 06/16/24  8:02 AM  Result Value Ref Range   Cholesterol 167 0 - 200 mg/dL   Triglycerides 29.9 0.0 - 149.0 mg/dL   HDL 38.69 >60.99 mg/dL   VLDL 85.9 0.0 - 59.9 mg/dL   LDL Cholesterol 91 0 - 99 mg/dL   Total CHOL/HDL Ratio 3    NonHDL 105.48   TSH   Collection Time: 06/16/24  8:02 AM  Result Value Ref Range   TSH 2.60 0.35 - 5.50 uIU/mL    Assessment and Plan  LLQ pain -     CBC with Differential/Platelet -     Comprehensive metabolic panel with GFR -     Lipase  Left lower quadrant abdominal pain Assessment & Plan:  Acute, differential includes constipation versus diverticulitis versus less likely femoral hernia Will evaluate with lab work including CBC and complete metabolic panel.  Will include lipase given on exam some tenderness in the epigastrium Will evaluate for diverticulitis with stat CT abdomen pelvis with contrast given history of diverticulosis on past colonoscopies.  Return and ER precautions provided.  Orders: -     CT ABDOMEN PELVIS W CONTRAST; Future    Return if symptoms worsen or fail to improve.   Greig Ring, MD

## 2024-08-19 NOTE — Assessment & Plan Note (Signed)
 Acute, differential includes constipation versus diverticulitis versus less likely femoral hernia Will evaluate with lab work including CBC and complete metabolic panel.  Will include lipase given on exam some tenderness in the epigastrium Will evaluate for diverticulitis with stat CT abdomen pelvis with contrast given history of diverticulosis on past colonoscopies.  Return and ER precautions provided.

## 2024-08-23 LAB — COMPREHENSIVE METABOLIC PANEL WITH GFR
ALT: 28 U/L (ref 0–53)
AST: 27 U/L (ref 0–37)
Albumin: 4.6 g/dL (ref 3.5–5.2)
Alkaline Phosphatase: 68 U/L (ref 39–117)
BUN: 20 mg/dL (ref 6–23)
CO2: 28 meq/L (ref 19–32)
Calcium: 10 mg/dL (ref 8.4–10.5)
Chloride: 107 meq/L (ref 96–112)
Creatinine, Ser: 0.84 mg/dL (ref 0.40–1.50)
GFR: 90.87 mL/min (ref >60.00)
Glucose, Bld: 99 mg/dL (ref 70–99)
Potassium: 4.3 meq/L (ref 3.5–5.1)
Sodium: 137 meq/L (ref 135–145)
Total Bilirubin: 0.5 mg/dL (ref 0.2–1.2)
Total Protein: 7.2 g/dL (ref 6.0–8.3)

## 2024-09-30 DIAGNOSIS — L821 Other seborrheic keratosis: Secondary | ICD-10-CM | POA: Diagnosis not present

## 2024-09-30 DIAGNOSIS — D2262 Melanocytic nevi of left upper limb, including shoulder: Secondary | ICD-10-CM | POA: Diagnosis not present

## 2024-09-30 DIAGNOSIS — D225 Melanocytic nevi of trunk: Secondary | ICD-10-CM | POA: Diagnosis not present

## 2024-09-30 DIAGNOSIS — L814 Other melanin hyperpigmentation: Secondary | ICD-10-CM | POA: Diagnosis not present

## 2024-09-30 DIAGNOSIS — L820 Inflamed seborrheic keratosis: Secondary | ICD-10-CM | POA: Diagnosis not present

## 2024-09-30 DIAGNOSIS — Z85820 Personal history of malignant melanoma of skin: Secondary | ICD-10-CM | POA: Diagnosis not present

## 2024-09-30 DIAGNOSIS — D1723 Benign lipomatous neoplasm of skin and subcutaneous tissue of right leg: Secondary | ICD-10-CM | POA: Diagnosis not present

## 2025-06-04 ENCOUNTER — Ambulatory Visit

## 2025-06-24 ENCOUNTER — Other Ambulatory Visit

## 2025-07-01 ENCOUNTER — Encounter: Admitting: Family Medicine
